# Patient Record
Sex: Female | Born: 1970 | Race: White | Hispanic: No | Marital: Single | Smoking: Current every day smoker
Health system: Southern US, Community
[De-identification: ages and names within clinical notes are randomized; demographics above are authoritative.]

## PROBLEM LIST (undated history)

## (undated) DIAGNOSIS — E079 Disorder of thyroid, unspecified: Secondary | ICD-10-CM

## (undated) DIAGNOSIS — E039 Hypothyroidism, unspecified: Secondary | ICD-10-CM

## (undated) HISTORY — PX: OTHER SURGICAL HISTORY: SHX169

## (undated) HISTORY — PX: ABDOMINAL HYSTERECTOMY: SHX81

---

## 1997-03-10 ENCOUNTER — Ambulatory Visit (HOSPITAL_COMMUNITY): Admission: RE | Admit: 1997-03-10 | Discharge: 1997-03-10 | Payer: Self-pay | Admitting: Obstetrics and Gynecology

## 1998-12-06 ENCOUNTER — Inpatient Hospital Stay (HOSPITAL_COMMUNITY): Admission: AD | Admit: 1998-12-06 | Discharge: 1998-12-06 | Payer: Self-pay | Admitting: Obstetrics & Gynecology

## 1998-12-19 ENCOUNTER — Inpatient Hospital Stay (HOSPITAL_COMMUNITY): Admission: AD | Admit: 1998-12-19 | Discharge: 1998-12-19 | Payer: Self-pay | Admitting: Obstetrics and Gynecology

## 1999-01-15 ENCOUNTER — Inpatient Hospital Stay (HOSPITAL_COMMUNITY): Admission: AD | Admit: 1999-01-15 | Discharge: 1999-01-15 | Payer: Self-pay | Admitting: Obstetrics and Gynecology

## 1999-01-26 ENCOUNTER — Inpatient Hospital Stay (HOSPITAL_COMMUNITY): Admission: AD | Admit: 1999-01-26 | Discharge: 1999-01-26 | Payer: Self-pay | Admitting: Obstetrics and Gynecology

## 1999-01-30 ENCOUNTER — Inpatient Hospital Stay (HOSPITAL_COMMUNITY): Admission: AD | Admit: 1999-01-30 | Discharge: 1999-01-30 | Payer: Self-pay | Admitting: Obstetrics & Gynecology

## 1999-02-23 ENCOUNTER — Inpatient Hospital Stay (HOSPITAL_COMMUNITY): Admission: AD | Admit: 1999-02-23 | Discharge: 1999-02-26 | Payer: Self-pay | Admitting: Obstetrics and Gynecology

## 1999-04-01 ENCOUNTER — Other Ambulatory Visit: Admission: RE | Admit: 1999-04-01 | Discharge: 1999-04-01 | Payer: Self-pay | Admitting: Obstetrics and Gynecology

## 2002-03-18 ENCOUNTER — Encounter: Payer: Self-pay | Admitting: Obstetrics and Gynecology

## 2002-03-18 ENCOUNTER — Ambulatory Visit (HOSPITAL_COMMUNITY): Admission: RE | Admit: 2002-03-18 | Discharge: 2002-03-18 | Payer: Self-pay | Admitting: Obstetrics and Gynecology

## 2002-09-01 ENCOUNTER — Other Ambulatory Visit: Admission: RE | Admit: 2002-09-01 | Discharge: 2002-09-01 | Payer: Self-pay | Admitting: Obstetrics and Gynecology

## 2002-09-29 ENCOUNTER — Observation Stay (HOSPITAL_COMMUNITY): Admission: RE | Admit: 2002-09-29 | Discharge: 2002-09-30 | Payer: Self-pay | Admitting: Obstetrics and Gynecology

## 2003-09-19 ENCOUNTER — Other Ambulatory Visit: Admission: RE | Admit: 2003-09-19 | Discharge: 2003-09-19 | Payer: Self-pay | Admitting: Obstetrics and Gynecology

## 2012-11-01 ENCOUNTER — Telehealth: Payer: Self-pay | Admitting: Genetic Counselor

## 2012-11-01 NOTE — Telephone Encounter (Signed)
[facility] APPT 01/08 @ 9 W/KAREN POWELL  REFERRING DR. Harold Hedge WELCOME PACKET MAILED.

## 2012-11-02 ENCOUNTER — Other Ambulatory Visit: Payer: Self-pay | Admitting: Obstetrics and Gynecology

## 2012-11-02 DIAGNOSIS — R928 Other abnormal and inconclusive findings on diagnostic imaging of breast: Secondary | ICD-10-CM

## 2012-11-15 ENCOUNTER — Ambulatory Visit
Admission: RE | Admit: 2012-11-15 | Discharge: 2012-11-15 | Disposition: A | Discharge status: Home / Self Care | Payer: PRIVATE HEALTH INSURANCE | Source: Ambulatory Visit | Attending: Obstetrics and Gynecology | Admitting: Obstetrics and Gynecology

## 2012-11-15 DIAGNOSIS — R928 Other abnormal and inconclusive findings on diagnostic imaging of breast: Secondary | ICD-10-CM

## 2013-02-03 ENCOUNTER — Ambulatory Visit (HOSPITAL_BASED_OUTPATIENT_CLINIC_OR_DEPARTMENT_OTHER): Payer: Self-pay | Admitting: Genetic Counselor

## 2013-02-03 ENCOUNTER — Other Ambulatory Visit: Payer: Self-pay

## 2013-02-03 ENCOUNTER — Encounter: Payer: Self-pay | Admitting: Genetic Counselor

## 2013-02-03 DIAGNOSIS — Z803 Family history of malignant neoplasm of breast: Secondary | ICD-10-CM

## 2013-02-03 NOTE — Progress Notes (Signed)
Patient did not show for her appointment.  This is the first no show.  If she would like to be rescheduled, please have her call the office.

## 2013-11-02 ENCOUNTER — Other Ambulatory Visit: Payer: Self-pay | Admitting: Obstetrics and Gynecology

## 2013-11-03 ENCOUNTER — Other Ambulatory Visit: Payer: Self-pay | Admitting: Obstetrics and Gynecology

## 2013-11-03 DIAGNOSIS — N6320 Unspecified lump in the left breast, unspecified quadrant: Secondary | ICD-10-CM

## 2013-11-04 LAB — CYTOLOGY - PAP

## 2013-11-10 ENCOUNTER — Other Ambulatory Visit: Payer: PRIVATE HEALTH INSURANCE

## 2013-11-14 ENCOUNTER — Encounter (INDEPENDENT_AMBULATORY_CARE_PROVIDER_SITE_OTHER): Payer: Self-pay

## 2013-11-14 ENCOUNTER — Ambulatory Visit
Admission: RE | Admit: 2013-11-14 | Discharge: 2013-11-14 | Disposition: A | Discharge status: Home / Self Care | Payer: PRIVATE HEALTH INSURANCE | Source: Ambulatory Visit | Attending: Obstetrics and Gynecology | Admitting: Obstetrics and Gynecology

## 2013-11-14 DIAGNOSIS — N6320 Unspecified lump in the left breast, unspecified quadrant: Secondary | ICD-10-CM

## 2014-03-29 ENCOUNTER — Encounter (HOSPITAL_COMMUNITY): Payer: Self-pay | Admitting: Emergency Medicine

## 2014-03-29 ENCOUNTER — Emergency Department (HOSPITAL_COMMUNITY): Payer: 59

## 2014-03-29 ENCOUNTER — Emergency Department (HOSPITAL_COMMUNITY): Payer: 59 | Admitting: Certified Registered"

## 2014-03-29 ENCOUNTER — Inpatient Hospital Stay (HOSPITAL_COMMUNITY)
Admission: EM | Admit: 2014-03-29 | Discharge: 2014-04-03 | DRG: 580 | Disposition: A | Discharge status: Home Health | Payer: 59 | Attending: General Surgery | Admitting: General Surgery

## 2014-03-29 ENCOUNTER — Encounter (HOSPITAL_COMMUNITY): Admission: EM | Disposition: A | Discharge status: Home Health | Payer: Self-pay | Source: Home / Self Care

## 2014-03-29 DIAGNOSIS — S55112A Laceration of radial artery at forearm level, left arm, initial encounter: Secondary | ICD-10-CM | POA: Diagnosis present

## 2014-03-29 DIAGNOSIS — D62 Acute posthemorrhagic anemia: Secondary | ICD-10-CM | POA: Diagnosis not present

## 2014-03-29 DIAGNOSIS — S41152A Open bite of left upper arm, initial encounter: Secondary | ICD-10-CM | POA: Diagnosis not present

## 2014-03-29 DIAGNOSIS — S41052A Open bite of left shoulder, initial encounter: Principal | ICD-10-CM | POA: Diagnosis present

## 2014-03-29 DIAGNOSIS — W540XXA Bitten by dog, initial encounter: Secondary | ICD-10-CM

## 2014-03-29 DIAGNOSIS — S81852A Open bite, left lower leg, initial encounter: Secondary | ICD-10-CM | POA: Diagnosis present

## 2014-03-29 DIAGNOSIS — S8992XA Unspecified injury of left lower leg, initial encounter: Secondary | ICD-10-CM

## 2014-03-29 DIAGNOSIS — S4992XA Unspecified injury of left shoulder and upper arm, initial encounter: Secondary | ICD-10-CM

## 2014-03-29 DIAGNOSIS — S71152A Open bite, left thigh, initial encounter: Secondary | ICD-10-CM | POA: Diagnosis present

## 2014-03-29 DIAGNOSIS — S51852A Open bite of left forearm, initial encounter: Secondary | ICD-10-CM | POA: Diagnosis present

## 2014-03-29 DIAGNOSIS — S61552A Open bite of left wrist, initial encounter: Secondary | ICD-10-CM | POA: Diagnosis present

## 2014-03-29 DIAGNOSIS — S55109A Unspecified injury of radial artery at forearm level, unspecified arm, initial encounter: Secondary | ICD-10-CM | POA: Diagnosis present

## 2014-03-29 DIAGNOSIS — S81859A Open bite, unspecified lower leg, initial encounter: Secondary | ICD-10-CM | POA: Diagnosis present

## 2014-03-29 DIAGNOSIS — S41159A Open bite of unspecified upper arm, initial encounter: Secondary | ICD-10-CM | POA: Diagnosis present

## 2014-03-29 DIAGNOSIS — D500 Iron deficiency anemia secondary to blood loss (chronic): Secondary | ICD-10-CM

## 2014-03-29 DIAGNOSIS — S61451A Open bite of right hand, initial encounter: Secondary | ICD-10-CM | POA: Diagnosis present

## 2014-03-29 HISTORY — DX: Disorder of thyroid, unspecified: E07.9

## 2014-03-29 HISTORY — PX: I&D EXTREMITY: SHX5045

## 2014-03-29 HISTORY — PX: COMPLEX WOUND CLOSURE: SHX6446

## 2014-03-29 LAB — COMPREHENSIVE METABOLIC PANEL
ALT: 12 U/L (ref 0–35)
AST: 28 U/L (ref 0–37)
Albumin: 2.6 g/dL — ABNORMAL LOW (ref 3.5–5.2)
Alkaline Phosphatase: 47 U/L (ref 39–117)
Anion gap: 7 (ref 5–15)
BUN: <5 mg/dL — ABNORMAL LOW (ref 6–23)
CALCIUM: 7.4 mg/dL — AB (ref 8.4–10.5)
CHLORIDE: 111 mmol/L (ref 96–112)
CO2: 20 mmol/L (ref 19–32)
CREATININE: 0.96 mg/dL (ref 0.50–1.10)
GFR calc non Af Amer: 71 mL/min — ABNORMAL LOW (ref >90)
GFR, EST AFRICAN AMERICAN: 83 mL/min — AB (ref >90)
Glucose, Bld: 275 mg/dL — ABNORMAL HIGH (ref 70–99)
POTASSIUM: 3.7 mmol/L (ref 3.5–5.1)
Sodium: 138 mmol/L (ref 135–145)
Total Bilirubin: 0.3 mg/dL (ref 0.3–1.2)
Total Protein: 4.6 g/dL — ABNORMAL LOW (ref 6.0–8.3)

## 2014-03-29 LAB — CK TOTAL AND CKMB (NOT AT ARMC)
CK, MB: 5.9 ng/mL — ABNORMAL HIGH (ref 0.3–4.0)
Relative Index: 1.2 (ref 0.0–2.5)
Total CK: 479 U/L — ABNORMAL HIGH (ref 7–177)

## 2014-03-29 LAB — CBC
HEMATOCRIT: 29.2 % — AB (ref 36.0–46.0)
HEMOGLOBIN: 9.9 g/dL — AB (ref 12.0–15.0)
MCH: 31.3 pg (ref 26.0–34.0)
MCHC: 33.9 g/dL (ref 30.0–36.0)
MCV: 92.4 fL (ref 78.0–100.0)
Platelets: 289 10*3/uL (ref 150–400)
RBC: 3.16 MIL/uL — AB (ref 3.87–5.11)
RDW: 13 % (ref 11.5–15.5)
WBC: 34 10*3/uL — ABNORMAL HIGH (ref 4.0–10.5)

## 2014-03-29 LAB — I-STAT CHEM 8, ED
BUN: 4 mg/dL — ABNORMAL LOW (ref 6–23)
CALCIUM ION: 1.09 mmol/L — AB (ref 1.12–1.23)
CHLORIDE: 107 mmol/L (ref 96–112)
Creatinine, Ser: 0.9 mg/dL (ref 0.50–1.10)
Glucose, Bld: 266 mg/dL — ABNORMAL HIGH (ref 70–99)
HEMATOCRIT: 30 % — AB (ref 36.0–46.0)
HEMOGLOBIN: 10.2 g/dL — AB (ref 12.0–15.0)
Potassium: 3.5 mmol/L (ref 3.5–5.1)
Sodium: 139 mmol/L (ref 135–145)
TCO2: 13 mmol/L (ref 0–100)

## 2014-03-29 LAB — PREPARE FRESH FROZEN PLASMA
Unit division: 0
Unit division: 0

## 2014-03-29 LAB — PROTIME-INR
INR: 1.23 (ref 0.00–1.49)
Prothrombin Time: 15.7 seconds — ABNORMAL HIGH (ref 11.6–15.2)

## 2014-03-29 LAB — ABO/RH: ABO/RH(D): O POS

## 2014-03-29 SURGERY — COMPLEX CLOSURE, WOUND
Anesthesia: General | Site: Leg Lower | Laterality: Left

## 2014-03-29 MED ORDER — OXYCODONE HCL 5 MG PO TABS: 5.0000 mg | ORAL_TABLET | Freq: Once | ORAL | Status: AC | PRN

## 2014-03-29 MED ORDER — SODIUM CHLORIDE 0.9 % IV BOLUS (SEPSIS)
1000.0000 mL | Freq: Once | INTRAVENOUS | Status: AC
  Administered 2014-03-29: 1000 mL via INTRAVENOUS

## 2014-03-29 MED ORDER — MIDAZOLAM HCL 5 MG/5ML IJ SOLN
INTRAMUSCULAR | Status: DC | PRN
Start: 2014-03-29 — End: 2014-03-30
  Administered 2014-03-29: 2 mg via INTRAVENOUS

## 2014-03-29 MED ORDER — MORPHINE SULFATE 4 MG/ML IJ SOLN
4.0000 mg | Freq: Once | INTRAMUSCULAR | Status: AC
  Administered 2014-03-29: 4 mg via INTRAVENOUS

## 2014-03-29 MED ORDER — PHENYLEPHRINE 40 MCG/ML (10ML) SYRINGE FOR IV PUSH (FOR BLOOD PRESSURE SUPPORT)
PREFILLED_SYRINGE | INTRAVENOUS | Status: AC
  Filled 2014-03-29: qty 10

## 2014-03-29 MED ORDER — HYDROMORPHONE HCL 1 MG/ML IJ SOLN
0.2500 mg | INTRAMUSCULAR | Status: DC | PRN
  Administered 2014-03-30 (×2): 0.5 mg via INTRAVENOUS

## 2014-03-29 MED ORDER — PROMETHAZINE HCL 25 MG/ML IJ SOLN
6.2500 mg | INTRAMUSCULAR | Status: DC | PRN
  Administered 2014-03-30: 12.5 mg via INTRAVENOUS

## 2014-03-29 MED ORDER — MIDAZOLAM HCL 2 MG/2ML IJ SOLN
INTRAMUSCULAR | Status: AC
  Filled 2014-03-29: qty 2

## 2014-03-29 MED ORDER — OXYCODONE HCL 5 MG/5ML PO SOLN: 5.0000 mg | Freq: Once | ORAL | Status: AC | PRN

## 2014-03-29 MED ORDER — SUCCINYLCHOLINE CHLORIDE 20 MG/ML IJ SOLN
INTRAMUSCULAR | Status: DC | PRN
  Administered 2014-03-29: 110 mg via INTRAVENOUS

## 2014-03-29 MED ORDER — LIDOCAINE HCL (CARDIAC) 20 MG/ML IV SOLN
INTRAVENOUS | Status: AC
  Filled 2014-03-29: qty 30

## 2014-03-29 MED ORDER — ONDANSETRON HCL 4 MG/2ML IJ SOLN
INTRAMUSCULAR | Status: AC
  Filled 2014-03-29: qty 2

## 2014-03-29 MED ORDER — FENTANYL CITRATE 0.05 MG/ML IJ SOLN
INTRAMUSCULAR | Status: DC | PRN
  Administered 2014-03-29: 50 ug via INTRAVENOUS

## 2014-03-29 MED ORDER — ROCURONIUM BROMIDE 50 MG/5ML IV SOLN
INTRAVENOUS | Status: AC
  Filled 2014-03-29: qty 1

## 2014-03-29 MED ORDER — PROPOFOL 10 MG/ML IV BOLUS
INTRAVENOUS | Status: AC
  Filled 2014-03-29: qty 20

## 2014-03-29 MED ORDER — ONDANSETRON HCL 4 MG/2ML IJ SOLN
4.0000 mg | Freq: Once | INTRAMUSCULAR | Status: AC
  Administered 2014-03-29: 4 mg via INTRAVENOUS

## 2014-03-29 MED ORDER — SODIUM CHLORIDE 0.9 % IR SOLN
Status: DC | PRN
  Administered 2014-03-29 (×2): 3000 mL

## 2014-03-29 MED ORDER — CEFAZOLIN SODIUM-DEXTROSE 2-3 GM-% IV SOLR
INTRAVENOUS | Status: DC | PRN
  Administered 2014-03-29: 2 g via INTRAVENOUS

## 2014-03-29 MED ORDER — NEOSTIGMINE METHYLSULFATE 10 MG/10ML IV SOLN
INTRAVENOUS | Status: AC
  Filled 2014-03-29: qty 6

## 2014-03-29 MED ORDER — EPHEDRINE SULFATE 50 MG/ML IJ SOLN
INTRAMUSCULAR | Status: AC
  Filled 2014-03-29: qty 1

## 2014-03-29 MED ORDER — SUCCINYLCHOLINE CHLORIDE 20 MG/ML IJ SOLN
INTRAMUSCULAR | Status: AC
  Filled 2014-03-29: qty 2

## 2014-03-29 MED ORDER — FENTANYL CITRATE 0.05 MG/ML IJ SOLN
INTRAMUSCULAR | Status: AC
  Filled 2014-03-29: qty 5

## 2014-03-29 MED ORDER — CEFAZOLIN SODIUM-DEXTROSE 2-3 GM-% IV SOLR
2.0000 g | Freq: Once | INTRAVENOUS | Status: AC
  Administered 2014-03-29: 2 g via INTRAVENOUS

## 2014-03-29 MED ORDER — LACTATED RINGERS IV SOLN
INTRAVENOUS | Status: DC | PRN
  Administered 2014-03-29 (×2): via INTRAVENOUS

## 2014-03-29 MED ORDER — PROPOFOL 10 MG/ML IV BOLUS
INTRAVENOUS | Status: DC | PRN
  Administered 2014-03-29: 120 mg via INTRAVENOUS

## 2014-03-29 MED ORDER — LIDOCAINE HCL (CARDIAC) 20 MG/ML IV SOLN
INTRAVENOUS | Status: DC | PRN
  Administered 2014-03-29: 80 mg via INTRAVENOUS

## 2014-03-29 MED ORDER — ALBUMIN HUMAN 5 % IV SOLN
INTRAVENOUS | Status: DC | PRN
  Administered 2014-03-29 (×2): via INTRAVENOUS

## 2014-03-29 MED ORDER — PHENYLEPHRINE HCL 10 MG/ML IJ SOLN
INTRAMUSCULAR | Status: DC | PRN
  Administered 2014-03-29: 80 ug via INTRAVENOUS
  Administered 2014-03-29: 160 ug via INTRAVENOUS
  Administered 2014-03-29 (×3): 80 ug via INTRAVENOUS
  Administered 2014-03-30: 160 ug via INTRAVENOUS

## 2014-03-29 MED ORDER — 0.9 % SODIUM CHLORIDE (POUR BTL) OPTIME
TOPICAL | Status: DC | PRN
  Administered 2014-03-29: 1000 mL

## 2014-03-29 SURGICAL SUPPLY — 63 items
BAG DECANTER FOR FLEXI CONT (MISCELLANEOUS) ×4 IMPLANT
BANDAGE ELASTIC 3 VELCRO ST LF (GAUZE/BANDAGES/DRESSINGS) ×4 IMPLANT
BANDAGE ELASTIC 4 VELCRO ST LF (GAUZE/BANDAGES/DRESSINGS) ×8 IMPLANT
BANDAGE ELASTIC 6 VELCRO ST LF (GAUZE/BANDAGES/DRESSINGS) ×12 IMPLANT
BNDG COHESIVE 1X5 TAN STRL LF (GAUZE/BANDAGES/DRESSINGS) ×8 IMPLANT
BNDG GAUZE ELAST 4 BULKY (GAUZE/BANDAGES/DRESSINGS) ×20 IMPLANT
CANISTER SUCTION 2500CC (MISCELLANEOUS) ×4 IMPLANT
CATH EMB 3FR 40CM (CATHETERS) ×4 IMPLANT
CLIP TI MEDIUM 6 (CLIP) ×4 IMPLANT
CLIP TI WIDE RED SMALL 6 (CLIP) ×4 IMPLANT
COVER PROBE W GEL 5X96 (DRAPES) ×4 IMPLANT
COVER SURGICAL LIGHT HANDLE (MISCELLANEOUS) ×4 IMPLANT
DRAPE EXTREMITY T 121X128X90 (DRAPE) IMPLANT
DRAPE LAPAROSCOPIC ABDOMINAL (DRAPES) IMPLANT
DRAPE PED LAPAROTOMY (DRAPES) ×4 IMPLANT
DRAPE PROXIMA HALF (DRAPES) ×4 IMPLANT
DRAPE UTILITY XL STRL (DRAPES) ×8 IMPLANT
DRSG PAD ABDOMINAL 8X10 ST (GAUZE/BANDAGES/DRESSINGS) ×4 IMPLANT
ELECT CAUTERY BLADE 6.4 (BLADE) ×4 IMPLANT
ELECT REM PT RETURN 9FT ADLT (ELECTROSURGICAL) ×4
ELECTRODE REM PT RTRN 9FT ADLT (ELECTROSURGICAL) ×2 IMPLANT
GAUZE SPONGE 4X4 12PLY STRL (GAUZE/BANDAGES/DRESSINGS) ×24 IMPLANT
GAUZE XEROFORM 5X9 LF (GAUZE/BANDAGES/DRESSINGS) ×20 IMPLANT
GLOVE BIO SURGEON STRL SZ7.5 (GLOVE) ×4 IMPLANT
GLOVE BIOGEL PI IND STRL 7.5 (GLOVE) ×2 IMPLANT
GLOVE BIOGEL PI IND STRL 8 (GLOVE) ×2 IMPLANT
GLOVE BIOGEL PI INDICATOR 7.5 (GLOVE) ×2
GLOVE BIOGEL PI INDICATOR 8 (GLOVE) ×2
GLOVE ECLIPSE 7.0 STRL STRAW (GLOVE) ×4 IMPLANT
GLOVE ECLIPSE 7.5 STRL STRAW (GLOVE) ×4 IMPLANT
GLOVE SS BIOGEL STRL SZ 7 (GLOVE) ×2 IMPLANT
GLOVE SUPERSENSE BIOGEL SZ 7 (GLOVE) ×2
GOWN STRL REUS W/ TWL LRG LVL3 (GOWN DISPOSABLE) ×4 IMPLANT
GOWN STRL REUS W/TWL LRG LVL3 (GOWN DISPOSABLE) ×6
KIT BASIN OR (CUSTOM PROCEDURE TRAY) ×8 IMPLANT
KIT ROOM TURNOVER OR (KITS) ×4 IMPLANT
LOOP VESSEL MAXI BLUE (MISCELLANEOUS) ×4 IMPLANT
LOOP VESSEL MINI RED (MISCELLANEOUS) ×4 IMPLANT
MARKER SKIN DUAL TIP RULER LAB (MISCELLANEOUS) ×4 IMPLANT
NS IRRIG 1000ML POUR BTL (IV SOLUTION) ×4 IMPLANT
PACK GENERAL/GYN (CUSTOM PROCEDURE TRAY) ×4 IMPLANT
PAD ARMBOARD 7.5X6 YLW CONV (MISCELLANEOUS) ×4 IMPLANT
SPONGE LAP 18X18 X RAY DECT (DISPOSABLE) ×8 IMPLANT
STAPLER VISISTAT (STAPLE) ×4 IMPLANT
STOCKINETTE IMPERVIOUS 9X36 MD (GAUZE/BANDAGES/DRESSINGS) IMPLANT
STOCKINETTE IMPERVIOUS LG (DRAPES) IMPLANT
STOCKINETTE TUBULAR 6 INCH (GAUZE/BANDAGES/DRESSINGS) ×4 IMPLANT
SUT ETHILON 4 0 PS 2 18 (SUTURE) ×32 IMPLANT
SUT SILK 2 0 TIES 10X30 (SUTURE) ×4 IMPLANT
SUT SILK 3 0 TIES 10X30 (SUTURE) ×4 IMPLANT
SUT VIC AB 2-0 CT1 27 (SUTURE) ×4
SUT VIC AB 2-0 CT1 TAPERPNT 27 (SUTURE) ×4 IMPLANT
SUT VIC AB 3-0 SH 27 (SUTURE) ×2
SUT VIC AB 3-0 SH 27X BRD (SUTURE) ×2 IMPLANT
SUT VICRYL RAPIDE 4/0 PS 2 (SUTURE) ×20 IMPLANT
SWAB COLLECTION DEVICE MRSA (MISCELLANEOUS) IMPLANT
SYR 20CC LL (SYRINGE) ×4 IMPLANT
SYR TB 1ML LUER SLIP (SYRINGE) ×4 IMPLANT
TOWEL OR 17X24 6PK STRL BLUE (TOWEL DISPOSABLE) ×4 IMPLANT
TOWEL OR 17X26 10 PK STRL BLUE (TOWEL DISPOSABLE) ×4 IMPLANT
TRAY FOLEY METER SIL LF 16FR (CATHETERS) ×4 IMPLANT
TUBE ANAEROBIC SPECIMEN COL (MISCELLANEOUS) IMPLANT
UNDERPAD 30X30 INCONTINENT (UNDERPADS AND DIAPERS) ×4 IMPLANT

## 2014-03-29 NOTE — ED Notes (Signed)
Attempting to establish 2nd IV site on right arm.   BP cuff taken off of right arm.  Unable to do BP on left arm due to damage to left arm.
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
## 2014-03-29 NOTE — Consult Note (Signed)
Reason for Consult:complex dog bites to left upper extremity Referring Physician: Jay Wyatt  Allison [name] is an 44 y.o. female.  HPI: s/p attack by own dog with multiple complex left upper extremity lacerations  No past medical history on file.  No past surgical history on file.  No family history on file.  Social History:  has no tobacco, alcohol, and drug history on file.  Allergies: Not on File  Medications: Scheduled:  Results for orders placed or performed during the hospital encounter of 03/29/14 (from the past 48 hour(s))  Prepare fresh frozen plasma     Status: None (Preliminary result)   Collection Time: 03/29/14  9:26 PM  Result Value Ref Range   Unit Number [id number]    Blood Component Type LIQ PLASMA    Unit division 00    Status of Unit ISSUED    Unit tag comment VERBAL ORDERS PER DR RANCOUR    Transfusion Status OK TO TRANSFUSE    Unit Number [id number]    Blood Component Type THW PLS APHR    Unit division 00    Status of Unit ISSUED    Unit tag comment VERBAL ORDERS PER DR RANCOUR    Transfusion Status OK TO TRANSFUSE   Type and screen     Status: None (Preliminary result)   Collection Time: 03/29/14 10:05 PM  Result Value Ref Range   ABO/RH(D) O POS    Antibody Screen PENDING    Sample Expiration 04/01/2014    Unit Number [id number]    Blood Component Type RBC LR PHER2    Unit division 00    Status of Unit ISSUED    Unit tag comment VERBAL ORDERS PER DR RANCOUR    Transfusion Status OK TO TRANSFUSE    Crossmatch Result PENDING    Unit Number [id number]    Blood Component Type RED CELLS,LR    Unit division 00    Status of Unit ISSUED    Unit tag comment VERBAL ORDERS PER DR RANCOUR    Transfusion Status OK TO TRANSFUSE    Crossmatch Result PENDING   I-Stat Chem 8, ED     Status: Abnormal   Collection Time: 03/29/14 10:18 PM  Result Value Ref Range   Sodium 139 135 - 145 mmol/L   Potassium 3.5 3.5 - 5.1 mmol/L   Chloride 107 96  - 112 mmol/L   BUN 4 (L) 6 - 23 mg/dL   Creatinine, Ser 0.90 0.50 - 1.10 mg/dL   Glucose, Bld 266 (H) 70 - 99 mg/dL   Calcium, Ion 1.09 (L) 1.12 - 1.23 mmol/L   TCO2 13 0 - 100 mmol/L   Hemoglobin 10.2 (L) 12.0 - 15.0 g/dL   HCT 30.0 (L) 36.0 - 46.0 %    Dg Chest Portable 1 View  03/29/2014   CLINICAL DATA:  Trauma.  Attacked by dog.  EXAM: PORTABLE CHEST - 1 VIEW  COMPARISON:  None.  FINDINGS: The heart size and mediastinal contours are within normal limits. There is no evidence of pulmonary edema, consolidation, pneumothorax, nodule or pleural fluid. The visualized skeletal structures are unremarkable.  IMPRESSION: No active disease.   Electronically Signed   By: Glenn  Yamagata M.D.   On: 03/29/2014 22:29    Review of Systems  All other systems reviewed and are negative.  Blood pressure 135/80, pulse 104, temperature 96.8 F (36 C), temperature source Temporal, resp. rate 14, SpO2 100 %. Physical Exam  Constitutional: She appears well-developed and well-nourished.  HENT:    Head: Normocephalic and atraumatic.  Cardiovascular: Normal rate.   Respiratory: Effort normal.  Musculoskeletal:       Left upper arm: She exhibits laceration.       Left forearm: She exhibits laceration.  Complex left upper extremity lacerations s/p dog attack  Skin: Skin is warm.  Psychiatric: She has a normal mood and affect. Her behavior is normal. Judgment and thought content normal.    Assessment/Plan: As above  Plan explore and repair as needed  Shaka Zech A 03/29/2014, 10:36 PM      

## 2014-03-29 NOTE — ED Notes (Addendum)
Patient was attacked by her own dog, American bulldog, allegedly unprovoked attack.  Patient with multiple bites on left arm, left thigh and left lower leg, below left eye and left upper lip.  Areas of tissue missing from lower leg, thigh and elbow on the left.  Patient arrived via [facility] with large amount of blood loss on scene.  Patient was given Fentanyl en route to ED.  Patient arrived CAOx3, vital signs stable, tachycardic at 110.  Per patient dog was up to date on all vaccinations.
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
## 2014-03-29 NOTE — Anesthesia Preprocedure Evaluation (Addendum)
Anesthesia Evaluation  Patient identified by MRN, date of birth, ID band Patient awake    Reviewed: Allergy & Precautions, NPO status , Patient's Chart, lab work & pertinent test results, Unable to perform ROS - Chart review only  History of Anesthesia Complications Negative for: history of anesthetic complications  Airway Mallampati: I  TM Distance: >3 FB Neck ROM: Full    Dental  (+) Partial Upper, Dental Advisory Given   Pulmonary Current Smoker,    Pulmonary exam normal       Cardiovascular negative cardio ROS      Neuro/Psych negative neurological ROS  negative psych ROS   GI/Hepatic negative GI ROS, Neg liver ROS,   Endo/Other  Hypothyroidism   Renal/GU negative Renal ROS     Musculoskeletal   Abdominal   Peds  Hematology   Anesthesia Other Findings   Reproductive/Obstetrics                            Anesthesia Physical Anesthesia Plan  ASA: III and emergent  Anesthesia Plan: General   Post-op Pain Management:    Induction: Intravenous and Rapid sequence  Airway Management Planned:   Additional Equipment:   Intra-op Plan: Delibrate Circulatory arrest per surgeon request  Post-operative Plan: Extubation in OR  Informed Consent: I have reviewed the patients History and Physical, chart, labs and discussed the procedure including the risks, benefits and alternatives for the proposed anesthesia with the patient or authorized representative who has indicated his/her understanding and acceptance.   Dental advisory given  Plan Discussed with: Anesthesiologist, Surgeon and CRNA  Anesthesia Plan Comments:        Anesthesia Quick Evaluation
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
## 2014-03-29 NOTE — Anesthesia Procedure Notes (Signed)
Procedure Name: Intubation Date/Time: 03/29/2014 10:52 PM Performed by: Arlice ColtMANESS, Shoua Ulloa B Pre-anesthesia Checklist: Patient identified, Emergency Drugs available, Suction available, Patient being monitored and Timeout performed Patient Re-evaluated:Patient Re-evaluated prior to inductionOxygen Delivery Method: Circle system utilized Preoxygenation: Pre-oxygenation with 100% oxygen Intubation Type: IV induction and Rapid sequence Laryngoscope Size: Mac and 3 Grade View: Grade II Tube type: Oral Tube size: 7.0 mm Number of attempts: 1 Airway Equipment and Method: Stylet Placement Confirmation: ETT inserted through vocal cords under direct vision,  positive ETCO2 and breath sounds checked- equal and bilateral Secured at: 21 cm Tube secured with: Tape Dental Injury: Teeth and Oropharynx as per pre-operative assessment

## 2014-03-29 NOTE — ED Notes (Signed)
Family at beside. Family given emotional support. 

## 2014-03-30 ENCOUNTER — Other Ambulatory Visit: Payer: Self-pay | Admitting: Orthopedic Surgery

## 2014-03-30 ENCOUNTER — Encounter (HOSPITAL_COMMUNITY): Payer: Self-pay | Admitting: General Surgery

## 2014-03-30 DIAGNOSIS — S61552A Open bite of left wrist, initial encounter: Secondary | ICD-10-CM | POA: Diagnosis present

## 2014-03-30 DIAGNOSIS — S55002A Unspecified injury of ulnar artery at forearm level, left arm, initial encounter: Secondary | ICD-10-CM

## 2014-03-30 DIAGNOSIS — W540XXA Bitten by dog, initial encounter: Secondary | ICD-10-CM

## 2014-03-30 DIAGNOSIS — S41052A Open bite of left shoulder, initial encounter: Secondary | ICD-10-CM | POA: Diagnosis present

## 2014-03-30 DIAGNOSIS — S55112A Laceration of radial artery at forearm level, left arm, initial encounter: Secondary | ICD-10-CM | POA: Diagnosis present

## 2014-03-30 DIAGNOSIS — S81852A Open bite, left lower leg, initial encounter: Secondary | ICD-10-CM | POA: Diagnosis present

## 2014-03-30 DIAGNOSIS — S51852A Open bite of left forearm, initial encounter: Secondary | ICD-10-CM | POA: Diagnosis present

## 2014-03-30 DIAGNOSIS — S45102A Unspecified injury of brachial artery, left side, initial encounter: Secondary | ICD-10-CM

## 2014-03-30 DIAGNOSIS — S41159A Open bite of unspecified upper arm, initial encounter: Secondary | ICD-10-CM | POA: Diagnosis present

## 2014-03-30 DIAGNOSIS — S71152A Open bite, left thigh, initial encounter: Secondary | ICD-10-CM | POA: Diagnosis present

## 2014-03-30 DIAGNOSIS — S81859A Open bite, unspecified lower leg, initial encounter: Secondary | ICD-10-CM | POA: Diagnosis present

## 2014-03-30 DIAGNOSIS — S61451A Open bite of right hand, initial encounter: Secondary | ICD-10-CM | POA: Diagnosis present

## 2014-03-30 DIAGNOSIS — D62 Acute posthemorrhagic anemia: Secondary | ICD-10-CM | POA: Diagnosis not present

## 2014-03-30 DIAGNOSIS — S41152A Open bite of left upper arm, initial encounter: Secondary | ICD-10-CM | POA: Diagnosis present

## 2014-03-30 LAB — CBC
HEMATOCRIT: 31.1 % — AB (ref 36.0–46.0)
Hemoglobin: 10.8 g/dL — ABNORMAL LOW (ref 12.0–15.0)
MCH: 30.3 pg (ref 26.0–34.0)
MCHC: 34.7 g/dL (ref 30.0–36.0)
MCV: 87.1 fL (ref 78.0–100.0)
Platelets: 173 10*3/uL (ref 150–400)
RBC: 3.57 MIL/uL — AB (ref 3.87–5.11)
RDW: 14.1 % (ref 11.5–15.5)
WBC: 14.3 10*3/uL — ABNORMAL HIGH (ref 4.0–10.5)

## 2014-03-30 LAB — BASIC METABOLIC PANEL
Anion gap: 8 (ref 5–15)
BUN: 5 mg/dL — AB (ref 6–23)
CHLORIDE: 110 mmol/L (ref 96–112)
CO2: 20 mmol/L (ref 19–32)
Calcium: 7.3 mg/dL — ABNORMAL LOW (ref 8.4–10.5)
Creatinine, Ser: 0.77 mg/dL (ref 0.50–1.10)
GFR calc non Af Amer: >90 mL/min (ref >90)
Glucose, Bld: 127 mg/dL — ABNORMAL HIGH (ref 70–99)
Potassium: 3.9 mmol/L (ref 3.5–5.1)
Sodium: 138 mmol/L (ref 135–145)

## 2014-03-30 LAB — POCT I-STAT 4, (NA,K, GLUC, HGB,HCT)
GLUCOSE: 188 mg/dL — AB (ref 70–99)
GLUCOSE: 208 mg/dL — AB (ref 70–99)
HCT: 23 % — ABNORMAL LOW (ref 36.0–46.0)
HEMATOCRIT: 18 % — AB (ref 36.0–46.0)
Hemoglobin: 6.1 g/dL — CL (ref 12.0–15.0)
Hemoglobin: 7.8 g/dL — ABNORMAL LOW (ref 12.0–15.0)
Potassium: 3.8 mmol/L (ref 3.5–5.1)
Potassium: 4.2 mmol/L (ref 3.5–5.1)
SODIUM: 139 mmol/L (ref 135–145)
SODIUM: 139 mmol/L (ref 135–145)

## 2014-03-30 LAB — BLOOD PRODUCT ORDER (VERBAL) VERIFICATION

## 2014-03-30 LAB — MRSA PCR SCREENING: MRSA by PCR: NEGATIVE

## 2014-03-30 LAB — PROTIME-INR
INR: 1.31 (ref 0.00–1.49)
Prothrombin Time: 16.5 seconds — ABNORMAL HIGH (ref 11.6–15.2)

## 2014-03-30 MED ORDER — BACITRACIN-NEOMYCIN-POLYMYXIN 400-5-5000 EX OINT
TOPICAL_OINTMENT | CUTANEOUS | Status: AC
  Filled 2014-03-30: qty 2

## 2014-03-30 MED ORDER — DOCUSATE SODIUM 100 MG PO CAPS
100.0000 mg | ORAL_CAPSULE | Freq: Two times a day (BID) | ORAL | Status: DC
  Administered 2014-03-30 – 2014-04-03 (×9): 100 mg via ORAL
  Filled 2014-03-30 (×9): qty 1

## 2014-03-30 MED ORDER — SODIUM CHLORIDE 0.9 % IV SOLN
INTRAVENOUS | Status: DC | PRN
  Administered 2014-03-29: via INTRAVENOUS

## 2014-03-30 MED ORDER — SODIUM CHLORIDE 0.9 % IR SOLN
Status: DC | PRN
  Administered 2014-03-30: 500 mL

## 2014-03-30 MED ORDER — PANTOPRAZOLE SODIUM 40 MG IV SOLR
40.0000 mg | Freq: Every day | INTRAVENOUS | Status: DC
  Filled 2014-03-30 (×3): qty 40

## 2014-03-30 MED ORDER — SODIUM CHLORIDE 0.9 % IV SOLN
3.0000 g | Freq: Four times a day (QID) | INTRAVENOUS | Status: DC
  Administered 2014-03-30 – 2014-03-31 (×8): 3 g via INTRAVENOUS
  Filled 2014-03-30 (×9): qty 3

## 2014-03-30 MED ORDER — BISACODYL 10 MG RE SUPP: 10.0000 mg | Freq: Every day | RECTAL | Status: DC | PRN

## 2014-03-30 MED ORDER — PANTOPRAZOLE SODIUM 40 MG PO TBEC
40.0000 mg | DELAYED_RELEASE_TABLET | Freq: Every day | ORAL | Status: DC
  Administered 2014-03-30 – 2014-04-02 (×4): 40 mg via ORAL
  Filled 2014-03-30 (×4): qty 1

## 2014-03-30 MED ORDER — BACIT-POLY-NEO HC 1 % EX OINT
TOPICAL_OINTMENT | CUTANEOUS | Status: DC | PRN
  Administered 2014-03-30: 1 via TOPICAL

## 2014-03-30 MED ORDER — OXYCODONE HCL 5 MG PO TABS
5.0000 mg | ORAL_TABLET | ORAL | Status: DC | PRN
  Administered 2014-03-30: 10 mg via ORAL
  Administered 2014-03-30 – 2014-03-31 (×2): 5 mg via ORAL
  Filled 2014-03-30 (×2): qty 2
  Filled 2014-03-30 (×2): qty 1

## 2014-03-30 MED ORDER — HYDROMORPHONE HCL 1 MG/ML IJ SOLN
INTRAMUSCULAR | Status: AC
  Filled 2014-03-30: qty 1

## 2014-03-30 MED ORDER — HYDROMORPHONE HCL 1 MG/ML IJ SOLN
1.0000 mg | INTRAMUSCULAR | Status: DC | PRN
  Administered 2014-03-30 (×10): 1 mg via INTRAVENOUS
  Administered 2014-03-31: 2 mg via INTRAVENOUS
  Administered 2014-03-31 (×2): 1 mg via INTRAVENOUS
  Administered 2014-03-31: 2 mg via INTRAVENOUS
  Filled 2014-03-30 (×7): qty 1
  Filled 2014-03-30: qty 2
  Filled 2014-03-30 (×4): qty 1
  Filled 2014-03-30: qty 2
  Filled 2014-03-30: qty 1

## 2014-03-30 MED ORDER — PROMETHAZINE HCL 25 MG/ML IJ SOLN
INTRAMUSCULAR | Status: AC
  Filled 2014-03-30: qty 1

## 2014-03-30 MED ORDER — ONDANSETRON HCL 4 MG/2ML IJ SOLN
4.0000 mg | Freq: Four times a day (QID) | INTRAMUSCULAR | Status: DC | PRN
  Administered 2014-04-01 – 2014-04-03 (×3): 4 mg via INTRAVENOUS
  Filled 2014-03-30 (×6): qty 2

## 2014-03-30 MED ORDER — SODIUM CHLORIDE 0.9 % IV SOLN
1.0000 g | Freq: Once | INTRAVENOUS | Status: AC
  Administered 2014-03-30: 1 g via INTRAVENOUS
  Filled 2014-03-30: qty 10

## 2014-03-30 MED ORDER — SODIUM CHLORIDE 0.9 % IV SOLN
INTRAVENOUS | Status: DC
  Administered 2014-03-30: 125 mL via INTRAVENOUS
  Administered 2014-03-31 – 2014-04-02 (×3): via INTRAVENOUS

## 2014-03-30 NOTE — Op Note (Signed)
See note [id number]

## 2014-03-30 NOTE — Op Note (Signed)
NAMDonnella Sham:  [name], Allison             ACCOUNT NO.:  [id number]  MEDICAL RECORD NO.:  [id number]  LOCATION:  2S05C                        FACILITY:  [facility]  PHYSICIAN:  Artist PaisMatthew A. Kem Parcher, M.D.DATE OF BIRTH:  1970-03-13  DATE OF PROCEDURE:  03/30/2014 DATE OF DISCHARGE:                              OPERATIVE REPORT   PREOPERATIVE DIAGNOSIS:  Complex left upper extremity laceration, status post dog bite left upper extremity as well as right hand dog bite.  POSTOPERATIVE DIAGNOSIS:  Complex left upper extremity laceration, status post dog bite left upper extremity as well as right hand dog bite.  PROCEDURE:  Left upper extremity exploration with incision and drainage and debridement of multiple wounds with loose primary closure as well as exploration of brachial artery, tying of the radial artery and at the antecubital fossa, as well as exploration of radial nerve at the wrist, median nerve at the wrist, ulnar artery at the wrist, and ulnar nerve at the wrist.  OPERATIVE DESCRIPTION:  Allison [name] is a 44 year old female, who was attacked by her own dog sustaining multiple complex upper extremity wounds on the left as well as on her right hand, as well as lower extremity.  Lower extremities were taken care of by the Trauma Service. Upper extremity was taken care of by myself and also Dr. Phillips GroutJ Lawson, Vascular Service.  The patient was taken to the operating suite and after the induction of adequate general anesthetic, upper extremities left and right and lower extremities were prepped and draped in the usual sterile fashion.  The left upper extremity was explored.  There were multiple complex lacerations including 1 in the webspace of the thumb, open thumb nail bed injury, transverse laceration at the distal forearm and wrist crease with median nerve exposed, as well as complex forearm lacerations and upper arm lacerations including the large complex __________ antecubital fossa with  exposed neurovascular structures.  All the wounds were irrigated with 9 L of normal saline. We debrided nonviable muscle out of the proximal wounds.  We explored the distal wound.  The distal wound and the median nerve was intact. The radial artery was intact.  The ulnar artery and ulnar nerve were intact.  The proximal forearm wounds were all explored.  In the antecubital fossa, we identified the median nerve what appeared to be the main branch of the brachial artery and then what appeared to be a loss of the integrity of the radial artery branch.  The ulnar artery branch appeared to be intact.  The upper arm area had several deep lacerations as well.  These were again thoroughly irrigated.  Nonviable tissue was debrided.  After this was undertaken, Dr. __________ Hart [name], Vascular Service also scrubbed in and identified the avulsed radial branch.  The brachial artery and the ulnar side were intact. Dopplerable pulses were seen at the brachial artery area as well as distally in the ulnar side.  There was retrograde flow into the radial artery.  Radial artery was unfortunately unreconstructable, was ligated and tied off with 3-0 silk ties, as well as several large veins.  At the end of this procedure, we did one final inspection of all the wounds and loosely closed them to  cover all vital structures with 4-0 Vicryl Rapide again very loosely.  After this was completed, we loosely dressed with Xeroform, 4x4s, and a compressive wrap.  We then prepped and draped the right hand.  Right hand had some laceration at the lower aspect of the thumb and also of the index finger.  These were dressed with Xeroform, 4x4s, and Coban wrap.  The patient's lower extremity issues were dealt with by Dr. __________, Trauma Service.  The patient was then taken to the recovery room in stable fashion.     Artist Pais Mina Marble, M.D.     MAW/MEDQ  D:  03/30/2014  T:  03/30/2014  Job:  [id number]

## 2014-03-30 NOTE — Progress Notes (Signed)
Inpatient Diabetes Program Recommendations  AACE/ADA: New Consensus Statement on Inpatient Glycemic Control (2013)  Target Ranges:  Prepandial:   less than 140 mg/dL      Peak postprandial:   less than 180 mg/dL (1-2 hours)      Critically ill patients:  140 - 180 mg/dL   Results for Allison [name], Allison [name] (MRN [id number]) as of 03/30/2014 10:32  Ref. Range 03/29/2014 22:05 03/29/2014 22:18 03/29/2014 23:41 03/30/2014 00:42 03/30/2014 05:00  Glucose Latest Range: 70-99 mg/dL 782275 (H) 956266 (H) 213208 (H) 188 (H) 127 (H)   Diabetes history: No Outpatient Diabetes medications: NA Current orders for Inpatient glycemic control: None  Inpatient Diabetes Program Recommendations Correction (SSI): Please consider ordering CBGs with Novolog correction scale if needed. HgbA1C: May want to consider ordering an A1C to evaluate glycemic control over the past 2-3 months. However, not sure of how accurate A1C would be given patient lost large amount of blood due to injuries sustained.  Note: Initial lab glucose was 275 mg/dl on 0/8/653/2/16 at 78:4622:05. Question elevated glucose related to stress hyperglycemia. Lab glucose this morning was 127 mg/dl at 9:625:00 am. Please consider ordering CBGs with Novolog correction scale if needed and may want to order an A1C.  Thanks, Orlando PennerMarie Shree Espey, RN, MSN, CCRN, CDE Diabetes Coordinator Inpatient Diabetes Program [phone] (Team Pager) [phone] ([facility] office) [phone] [facility] office)

## 2014-03-30 NOTE — Evaluation (Signed)
Occupational Therapy Evaluation Patient Details Name: Allison Allison [name] MRN: [id number] DOB: 1970/10/22 Today's Date: 03/30/2014    History of Present Illness Pt is a 44 y.o. female admitted after dog bite at home with multiple lacerations to LUE, LLE, R hand, s/p Exploration left brachial radial and ulnar artery with ligation radial artery at origin and distally, thrombectomy left radial artery   Clinical Impression   PTA pt lived at home with her family and was independent with ADLs. Pt was lethargic from pain medication today but agreeable to gentle PROM of LUE. Brief bedside evaluation completed. Per PT note, pt required min A hand held assist to stand, but was limited by lightheadedness and nausea today. Performed gentle PROM to LUE hand as tolerated and educated pt on edema control. Pt will benefit from acute OT for further education and training on edema control, ROM exercises, and compensatory techniques for ADLs. Recommend that pt perform ROM exercises and progress rehab of UE as directed by MD.     Follow Up Recommendations  Outpatient OT;Other (comment) (progress Rehab of LUE as directed by MD)    Equipment Recommendations  Other (comment) (TBD)    Recommendations for Other Services       Precautions / Restrictions Precautions Precautions: Fall      Mobility Bed Mobility               General bed mobility comments: Pt in bed and lethargic from pain medication. Not assessed at this time.   Transfers                 General transfer comment: Not assessed at this time. Pt lethargic from pain medicaiton.          ADL Overall ADL's : Allison [name] assistance/impaired Eating/Feeding: Bed level;Moderate assistance   Grooming: Moderate assistance;Bed level   Upper Body Bathing: Bed level;Maximal assistance       Upper Body Dressing : Maximal assistance;Bed level                     General ADL Comments: Pt recently returned to bed and given dilaudid.  Pt is lethargic and performed brief bedside eval with ROM of LUE as tolerated.      Vision Additional Comments: Pt reports no change from baseline.           Pertinent Vitals/Pain Pain Assessment: Faces Faces Pain Scale: Hurts even more Pain Location: Left side Pain Descriptors / Indicators: Aching;Numbness Pain Intervention(s): Limited activity within patient's tolerance;Monitored during session;Premedicated before session;Repositioned        Extremity/Trunk Assessment Upper Extremity Assessment Upper Extremity Assessment: LUE deficits/detail;RUE deficits/detail RUE Deficits / Details: R hand wtih lacerations and gauze/dressing on thumb and 2nd digit.  Light touch and pressure intact LUE Deficits / Details: minimal AROM of L shoulder, likely due to numbness and weight of splint. Pt is splinted from wrist to shoulder with elbow  splinted in mild flexion (~5*). Pt able to minimally wiggle fingers with best movement in 5th,4th, and 2nd digits. Thumb is wrapped from MCP to tip. Edema noted in shoulder and hand/fingers. Pt has very minimal movement in MCPs due to swelling. Pt can feel touch on shoulder and palm and back of hand, but reports that it "feels numb, like it's asleep." Pt also reports pressure near shoulder.  LUE: Unable to fully assess due to pain;Unable to fully assess due to immobilization LUE Sensation: decreased light touch LUE Coordination: decreased fine motor;decreased gross motor   Lower Extremity  Assessment Lower Extremity Assessment: Defer to PT evaluation   Cervical / Trunk Assessment Cervical / Trunk Assessment: Normal   Communication Communication Communication: No difficulties   Cognition Arousal/Alertness: Lethargic;Suspect due to medications Behavior During [facility] for tasks assessed/performed Overall Cognitive Status: Within Functional Limits for tasks assessed                        Exercises Exercises: Other exercises Other  Exercises Other Exercises: provided gentle PROM to LUE hand within tolerance. MCPs with edema and very minimal movement, however gently increased ROM of DIP and PIP through passive flexion and extension. Pt lethargic and falling asleep during PROM, however would awake from pain.         Home Living Family/patient expects to be discharged to:: Private residence Living Arrangements: Spouse/significant other;Children Available Help at Discharge: Family;Available 24 hours/day Type of Home: House Home Access: Stairs to enter Enter[employer] CorporationEntrance Stairs-Number of Steps: 2   Home Layout: Two level;Full bath on main level;Able to live on main level with bedroom/bathroom               Home Equipment: None          Prior Functioning/Environment Level of Independence: Independent             OT Diagnosis: Acute pain   OT Problem List: Decreased strength;Decreased range of motion;Decreased activity tolerance;Impaired balance (sitting and/or standing);Decreased coordination;Impaired sensation;Impaired UE functional use;Pain;Increased edema   OT Treatment/Interventions: Self-care/ADL training;Therapeutic exercise;Energy conservation;DME and/or AE instruction;Therapeutic activities;Patient/family education;Balance training    OT Goals(Current goals can be found in the care plan section) Acute Rehab OT Goals Patient Stated Goal: return to work and family OT Goal Formulation: With patient Time For Goal Achievement: 04/13/14 Potential to Achieve Goals: Good ADL Goals Pt Will Perform Eating: with set-up;sitting Pt Will Perform Grooming: with set-up;sitting Pt Will Perform Upper Body Bathing: sitting;with min assist Pt Will Perform Upper Body Dressing: with min assist;sitting Pt Will Transfer to Toilet: with supervision;ambulating Pt Will Perform Toileting - Clothing Manipulation and hygiene: with supervision;sit to/from stand Pt/caregiver will Perform Home Exercise Program: Increased ROM;Left  upper extremity;Independently;With written HEP provided  OT Frequency: Min 3X/week    End of Session Nurse Communication: Other (comment) (repositioned for edema control)  Activity Tolerance: Patient limited by pain;Patient limited by lethargy Patient left: in bed;with call bell/phone within reach   Time: 1400-1419 OT Time Calculation (min): 19 min Charges:  OT General Charges $OT Visit: 1 Procedure OT Evaluation $Initial OT Evaluation Tier I: 1 Procedure G-Codes:    Rae LipsMiller, Jayce Boyko M 03/30/2014, 3:26 PM   Carney LivingLeeAnn Marie Porter Moes, OTR/L Occupational Therapist [phone] (pager)

## 2014-03-30 NOTE — Progress Notes (Signed)
Paged TRAUMA PA to inform them patient's numbness/heaviness feeling in arm is still present, TRAUMA MD,VASCULAR MD, and ORTHO were made aware during their individual rounds.  +dopple palm pulse/ulnar pulse, +sensation in arm from shoulder to fingertips, +pain, + warm/WNL/swelling   Will monitor

## 2014-03-30 NOTE — Progress Notes (Signed)
Chaplain met with pt, pt's spouse and brother in law at bedside.  Chaplain shared that daughter is doing well and wishes to meet with pt sometime today.  RN's on both units are working out a plan for pt's to meet, possibly in solarium on 3rd floor.  Pt is still emotional when sharing about event, spouse also seems upset at bedside.  Pt eating jello during visit, smiling and engaging in conversation.  Chaplain provided emotional support as well as ministry of non-anxious presence and empathetic listening.  Chaplain will continue to follow up.    03/30/14 0900  Clinical Encounter Type  Visited With Patient and family together  Visit Type Initial;Social support;Critical Care  Referral From Chaplain  Spiritual Encounters  Spiritual Needs Emotional;Grief support  Stress Factors  Patient Stress Factors Exhausted;Family relationships;Health changes  Family Stress Factors Exhausted;Family relationships;Health changes   Overcash, Peter A, Chaplain 03/30/2014 9:49 AM  

## 2014-03-30 NOTE — Progress Notes (Signed)
Clinical Social Work Department BRIEF PSYCHOSOCIAL ASSESSMENT 03/30/2014  Patient:  Allison [name], Allison [name]     Account Number:  [id number]     Admit date:  03/29/2014  Clinical Social Worker:  Ulyess Blossom  Date/Time:  03/30/2014 10:33 PM  Referred by:  CSW  Date Referred:  03/30/2014 Referred for  Psychosocial assessment   Other Referral:   Interview type:  Patient Other interview type:   Database review.    PSYCHOSOCIAL DATA Living Status:  FAMILY Admitted from facility:   Level of care:   Primary support name:  ruby lawrence Primary support relationship to patient:  PARENT Degree of support available:   Pt reports good support from her mother,father and husband, as well as her 2 minor children, ages 62 and 25.    CURRENT CONCERNS Current Concerns  None Noted   Other Concerns:   Emotional support/adjustment to injuries.    SOCIAL WORK ASSESSMENT / PLAN CSW met with pt prior to her tx to tx to SDU.  Role of CSW d/c planning explained.  Pt was failrly lethargic during interview due to the pain meds she had on-board.  Pt was able to confirm that she lived at home with her husband and children and was independent with ADLs pta.  Pt recalled meeting with her daughter Laurette Schimke) today and was able to share some details of the dog attack with CSW.  Pt stated that their dog bit into Haley's neck and pt tried to pull the dog off, when he attacked her.  Pt told CSW that this behavior was not expected from the dog.  CSW unable to get much more information from pt as she kept nodding off. SBIRT was attempted. Trauma CSW will continue to follow.   Assessment/plan status:  Psychosocial Support/Ongoing Assessment of Needs Other assessment/ plan:   Information/referral to community resources:    PATIENT'S/FAMILY'S RESPONSE TO PLAN OF CARE: Pt sleepy from pain medications.  She feel asleep several times during CSW interview and was able to provide minimal information.  SBIRT to be  completed at more appropriate time.

## 2014-03-30 NOTE — Op Note (Signed)
OPERATIVE REPORT  DATE OF OPERATION:03/30/2014  PATIENT:  Allison [name]  44 y.o. female  PRE-OPERATIVE DIAGNOSIS:  multiple dog bites  POST-OPERATIVE DIAGNOSIS:  multiple dog bites  PROCEDURE:  Procedure(s): CLOSURE MULTIPLE LACERATIONS LEFT LOWER LEG AND THIGH, total of twenty lacerations, 15 complex repairs, 5 simple, see below for detail of laceration lengths  SURGEON:  Surgeon(s): Frederik SchmidtJay Shady Bradish, MD [name], MD Pryor OchoaJames D Lawson, MD  ASSISTANT: None  ANESTHESIA:   general  EBL: 300 ml  BLOOD ADMINISTERED: 650 CC PRBC  DRAINS: Urinary Catheter (Foley)   SPECIMEN:  No Specimen  COUNTS CORRECT:  YES  PROCEDURE DETAILS: The patient was taken to the operating room and placed on the table in the supine position. Her arms were placed on it 90 to body because of multiple dog bite lacerations of both upper extremities. Her left leg was only lower extremity injured. She had multiple lacerations counting a total of 20 there were complex and required repair.  After she was intubated a proper timeout was performed identifying the patient and the procedures proposed including repair and expiration of left upper extremity and repair and irrigation and debridement and suturing of left lower extremity lacerations. The patient had a total of 20 lacerations of her left lower extremity which were repaired. The lacerations were listed as follows. #1 a 7.5 cm medial thigh laceration; #2 a 1 cm medial thigh laceration which was stapled; #3 a 4 cm anterior medial thigh laceration; #4 a 1 cm anterior medial thigh laceration;  #5 a 1 cm anterior thigh laceration; #6 a 1.5 cm lateral thigh laceration; #7 a 3.5 cm lateral thigh laceration; #8 a 5 cm anterior lateral thigh laceration;  #9 a 1.5 cm anterior thigh laceration, #10 a 9 cm anterior tibial laceration; #11 a 2 cm anterior tibial laceration; #12 a 9.5 cm medial left lower extremity laceration; #13 a 2 cm anterior laceration; #14 a 5 cm anterior  lower left lower extremity laceration; #15 a 1 cm anterior tibial laceration; #16 a 1.5 cm anterior tibial laceration; #17 a 3 cm posterior distal left lower extremity laceration; #18 a 3.5 cm posterior extremity laceration on the left; #19 a 2 cm posterior left lower extremity laceration and # 20 a 5 cm posterior left lower extremity laceration.  Copious amounts of irrigation was performed with Betadine solution, saline solution and all the wounds were scrubbed with a Betadine brush prior to closure. All of the listed lacerations above that were greater than 1.5 cm in size were repaired in one or two layers of 3-0 Vicryl for deep layer and running simple stitches of 4-0 nylon. All wounds were completely closed. Those less than 1.5 cm in size were stapled closed. This was done concomitant with irrigation debridement repair and exploration of left upper extremity complex laceration with the hand surgeon and the vascular surgeon.  Once the lacerations were completely repaired the wounds were washed and triple antibiotic ointment applied to all wounds followed by 4 x 4's, Kerlix gauze, and an Ace wrap. All counts were correct including needles, sponges, and instruments.   PATIENT DISPOSITION:  PACU - guarded condition.   Keron Neenan, JAY 3/3/20161:23 AM

## 2014-03-30 NOTE — Transfer of Care (Signed)
Immediate Anesthesia Transfer of Care Note  Patient: Allison [name]  Procedure(s) Performed: Procedure(s): CLOSURE MULTIPLE LACERATIONS LEFT LEG AND Left ARM, Embolectomy., LIGATION OF RADIAL ARTERY (Left) IRRIGATION AND DEBRIDEMENT CLOSURE MULTIPLE LACERATIONS HAND AND ARM, lower legs (Left)  Patient Location: PACU  Anesthesia Type:General  Level of Consciousness: awake and patient cooperative  Airway & Oxygen Therapy: Patient Spontanous Breathing and Patient connected to nasal cannula oxygen  Post-op Assessment: Report given to RN and Post -op Vital signs reviewed and stable  Post vital signs: Reviewed and stable  Last Vitals:  Filed Vitals:   03/30/14 0140  BP: 120/84  Pulse:   Temp: 35.9 C  Resp:     Complications: No apparent anesthesia complications

## 2014-03-30 NOTE — Progress Notes (Addendum)
Patient ID: Allison [name], female   DOB: March 31, 1970, 44 y.o.   MRN: [id number] Vascular surgery  Patient has brisk arterial flow and ulnar artery and left wrist and also arterial flow in palmar arch and left hand. She does have motion and sensation in the hand. Arterial supply certainly appears adequate with good flow through ulnar artery and retrograde flow of radial artery to area where it was avulsed and transected in  the proximal forearm.  Continue to follow arterial flow by Doppler and do not feel further vascular intervention will be necessary

## 2014-03-30 NOTE — Evaluation (Signed)
Physical Therapy Evaluation Patient Details Name: Allison [name] MRN: [id number] DOB: July 27, 1970 Today's Date: 03/30/2014   History of Present Illness  Pt admitted after dog bite at home with multiple lacerations to LUE, LLE, R hand, s/p Exploration left brachial radial and ulnar artery with ligation radial artery at origin and distally, thrombectomy left radial artery  Clinical Impression  Pt is very pleasant and willing to move. States it feels weird to stand on LLE but no LOB or buckling in standing. Mobility limited by lightheadedness and nausea, premedicated and probably due to meds. Pt with below deficits who will benefit from acute therapy to maximize mobility, strength, function and gait to return pt to PLOF and decrease burden of care. Anticipate quick progression and will defer LUE deficits to OT. Encouraged increased ROM and mobility with nursing as able.     Follow Up Recommendations Outpatient PT    Equipment Recommendations  None recommended by PT    Recommendations for Other Services OT consult     Precautions / Restrictions Precautions Precautions: Fall      Mobility  Bed Mobility Overal bed mobility: Needs Assistance Bed Mobility: Rolling;Sidelying to Sit Rolling: Min assist Sidelying to sit: Mod assist       General bed mobility comments: cues and assist to elevate trunk from surface and scoot pelvis to EOB  Transfers Overall transfer level: Needs assistance Equipment used: 1 person hand held assist Transfers: Sit to/from [employer] Sit to Stand: Min assist Stand pivot transfers: Min assist       General transfer comment: RUE supported with cues for sequence. Stood x 2 from bed and pivoted to chair. Did not attempt ambulation secondary to pt feeling lightheaded and nauseated but no drop in BP.   Ambulation/Gait                Stairs            Wheelchair Mobility    Modified Rankin (Stroke Patients Only)        Balance Overall balance assessment: Needs assistance   Sitting balance-Leahy Scale: Good       Standing balance-Leahy Scale: Good                               Pertinent Vitals/Pain Pain Assessment: 0-10 Pain Score: 6  Pain Location: entire left side Pain Descriptors / Indicators: Aching;Numbness Pain Intervention(s): Limited activity within patient's tolerance;Premedicated before session;Repositioned    Home Living Family/patient expects to be discharged to:: Private residence Living Arrangements: Spouse/significant other;Children Available Help at Discharge: Family;Available 24 hours/day Type of Home: House Home Access: Stairs to enter   [employer] of Steps: 2 Home Layout: Two level;Full bath on main level;Able to live on main level with bedroom/bathroom Home Equipment: None      Prior Function Level of Independence: Independent               Hand Dominance        Extremity/Trunk Assessment   Upper Extremity Assessment: LUE deficits/detail       LUE Deficits / Details: pt with very limited AROM LUE shoulder, elbow and digits. Able to wiggle fingers slightly, shrug shoulder . ROM limited by pain, edema and dressing. Can feel touch on shoulder and palm   Lower Extremity Assessment: LLE deficits/detail   LLE Deficits / Details: limited by pain grossly 3/5 strength did not resist secondary to pain, decreased hip and knee ROM  Cervical / Trunk Assessment: Normal  Communication   Communication: No difficulties  Cognition Arousal/Alertness: Awake/alert Behavior During Therapy: WFL for tasks assessed/performed Overall Cognitive Status: Within Functional Limits for tasks assessed                      General Comments      Exercises General Exercises - Lower Extremity Short Arc Quad: AROM;Left;5 reps;Seated      Assessment/Plan    PT Assessment Patient needs continued PT services  PT Diagnosis Difficulty  walking;Acute pain   PT Problem List Decreased strength;Decreased activity tolerance;Decreased balance;Decreased mobility;Pain  PT Treatment Interventions DME instruction;Gait training;Stair training;Functional mobility training;Therapeutic activities;Therapeutic exercise;Patient/family education   PT Goals (Current goals can be found in the Care Plan section) Acute Rehab PT Goals Patient Stated Goal: return home and to work (works with [employer]) PT Goal Formulation: With patient Time For Goal Achievement: 04/06/14 Potential to Achieve Goals: Good    Frequency Min 4X/week   Barriers to discharge        Co-evaluation               End of Session   Activity Tolerance: Patient tolerated treatment well;Patient limited by pain Patient left: in chair;with call bell/phone within reach;with nursing/sitter in room;with family/visitor present Nurse Communication: Mobility status         Time: [id number] PT Time Calculation (min) (ACUTE ONLY): 35 min   Charges:   PT Evaluation $Initial PT Evaluation Tier I: 1 Procedure PT Treatments $Therapeutic Activity: 8-22 mins   PT G CodesDelorse Lek:        Tabor, Hildred Mollica Beth 03/30/2014, 11:26 AM Delaney MeigsMaija Tabor Lillyian Heidt, PT [phone]

## 2014-03-30 NOTE — Progress Notes (Signed)
UR completed.  Shalika Arntz, RN BSN MHA CCM Trauma/Neuro ICU Case Manager [phone]  

## 2014-03-30 NOTE — Progress Notes (Addendum)
Patient ID: Allison [name], female   DOB: 04/13/1970, 44 y.o.   MRN: [id number] 1 Day Post-Op  Subjective: Sore, not much appetite  Objective: Vital signs in last 24 hours: Temp:  [96.6 F (35.9 C)-98.2 F (36.8 C)] 98.2 F (36.8 C) (03/03 0758) Pulse Rate:  [77-110] 87 (03/03 0700) Resp:  [10-23] 10 (03/03 0700) BP: (115-150)/(72-92) 117/72 mmHg (03/03 0700) SpO2:  [93 %-100 %] 100 % (03/03 0700) Weight:  [130 lb (58.968 kg)] 130 lb (58.968 kg) (03/02 2235)    Intake/Output from previous day: 03/02 0701 - 03/03 0700 In: 5026.3 [I.V.:3706.3; Blood:670; IV Piggyback:600] Out: 755 [Urine:755] Intake/Output this shift:    General appearance: alert and cooperative Resp: clear to auscultation bilaterally Cardio: regular rate and rhythm GI: soft, NT, ND Extremities: R thumb and index dressed, LUE large dressing - fingers warm and moving, LLE dressings with Ace wraps Pulses: doppler signal L ulnar artery and L palmar arch  Lab Results: CBC   Recent Labs  03/29/14 2205  03/30/14 0042 03/30/14 0500  WBC 34.0*  --   --  14.3*  HGB 9.9*  < > 7.8* 10.8*  HCT 29.2*  < > 23.0* 31.1*  PLT 289  --   --  173  < > = values in this interval not displayed. BMET  Recent Labs  03/29/14 2205 03/29/14 2218  03/30/14 0042 03/30/14 0500  NA 138 139  < > 139 138  K 3.7 3.5  < > 4.2 3.9  CL 111 107  --   --  110  CO2 20  --   --   --  20  GLUCOSE 275* 266*  < > 188* 127*  BUN <5* 4*  --   --  5*  CREATININE 0.96 0.90  --   --  0.77  CALCIUM 7.4*  --   --   --  7.3*  < > = values in this interval not displayed. PT/INR  Recent Labs  03/29/14 2205 03/30/14 0500  LABPROT 15.7* 16.5*  INR 1.23 1.31   ABG No results for input(s): PHART, HCO3 in the last 72 hours.  Invalid input(s): PCO2, PO2  Studies/Results: Dg Forearm Left  03/29/2014   CLINICAL DATA:  Attacked by dog sustaining multiple soft tissue injuries of left arm.  EXAM: LEFT FOREARM - 2 VIEW  COMPARISON:  None.   FINDINGS: The left forearm demonstrates massive soft tissue injury, particularly involving the proximal forearm and antecubital region. Soft tissue gas is present. No evidence of fracture or soft tissue foreign body.  IMPRESSION: Massive soft tissue injury to the left forearm without visible underlying foreign body or fracture of bone.   Electronically Signed   By: Irish Lack M.D.   On: 03/29/2014 22:34   Dg Chest Portable 1 View  03/29/2014   CLINICAL DATA:  Trauma.  Attacked by dog.  EXAM: PORTABLE CHEST - 1 VIEW  COMPARISON:  None.  FINDINGS: The heart size and mediastinal contours are within normal limits. There is no evidence of pulmonary edema, consolidation, pneumothorax, nodule or pleural fluid. The visualized skeletal structures are unremarkable.  IMPRESSION: No active disease.   Electronically Signed   By: Irish Lack M.D.   On: 03/29/2014 22:29   Dg Humerus Left  03/29/2014   CLINICAL DATA:  Multiple dog bites to the left arm. Initial encounter.  EXAM: LEFT HUMERUS - 1+ VIEW  COMPARISON:  None.  FINDINGS: Extensive subcutaneous gas throughout the left arm and visible upper forearm. There  is no opaque foreign body or definite fracture (minimal cortical irregularity involving the medial aspect of the mid humeral diaphysis is likely overlapping subcutaneous gas).  IMPRESSION: Multiple lacerations throughout the left upper extremity. No opaque foreign body or definite fracture; recommend two-view imaging when clinically able.   Electronically Signed   By: Marnee SpringJonathon  Watts M.D.   On: 03/29/2014 22:47    Anti-infectives: Anti-infectives    Start     Dose/Rate Route Frequency Ordered Stop   03/30/14 0400  Ampicillin-Sulbactam (UNASYN) 3 g in sodium chloride 0.9 % 100 mL IVPB     3 g 100 mL/hr over 60 Minutes Intravenous Every 6 hours 03/30/14 0256     03/29/14 2200  ceFAZolin (ANCEF) IVPB 2 g/50 mL premix     2 g 100 mL/hr over 30 Minutes Intravenous  Once 03/29/14 2157 03/29/14 2228       Assessment/Plan: Multiple dog bites S/P repair mult lacs R hand, LUE, LLE - by Dr. Mina MarbleWeingold and Dr. Lindie SpruceWyatt, change outer dressings L radial artery injury - S/P ligation by Dr. Hart [name], hand perfused, follow dopplers ABL anemia - improved S/P TF ID - Unasyn empiric FEN - replace hypocalcemia, clears and advance as tolerated PT/OT VTE - PAS as tol, no Lovenox until Hb stable Dispo - to SDU  LOS: 0 days    Violeta GelinasBurke Crissy Mccreadie, MD, MPH, FACS Trauma: [phone] General Surgery: [phone]  03/30/2014

## 2014-03-30 NOTE — Progress Notes (Signed)
Patient states that her left arm feels like "it is not there." And is "heavy and numb." She has to use her right arm to pick up her left arm to move it. She is able to move her fingers slightly to command. She feels me touching her shoulder and feels me touching her fingers. Loud dopplerable pulse in palm. Will continue to closely monitor. Thresa RossA. Angle Karel RN

## 2014-03-30 NOTE — Anesthesia Postprocedure Evaluation (Signed)
Anesthesia Post Note  Patient: Allison [name]  Procedure(s) Performed: Procedure(s) (LRB): CLOSURE MULTIPLE LACERATIONS LEFT LEG AND Left ARM, Embolectomy., LIGATION OF RADIAL ARTERY (Left) IRRIGATION AND DEBRIDEMENT CLOSURE MULTIPLE LACERATIONS HAND AND ARM, lower legs (Left)  Anesthesia type: general  Patient location: PACU  Post pain: Pain level controlled  Post assessment: Patient's Cardiovascular Status Stable  Last Vitals:  Filed Vitals:   03/30/14 0545  BP: 127/76  Pulse: 87  Temp:   Resp: 16    Post vital signs: Reviewed and stable  Level of consciousness: sedated  Complications: No apparent anesthesia complications

## 2014-03-30 NOTE — Progress Notes (Signed)
Patient examined at bedside  Warm hand with normal gross sensation   Will do dressing change in OR 3/5

## 2014-03-30 NOTE — Op Note (Signed)
OPERATIVE REPORT  Date of Surgery: 03/29/2014  Surgeon: Josephina GipJames Lawson, MD  Assistant: Dr. [name]  Pre-op Diagnosis: multiple dog bites with vascular injury left upper extremity  Post-op Diagnosis: Extensive lacerations left upper extremity with a avulsion and transection left radial artery Procedure: Procedure(s): Exploration left brachial radial and ulnar artery with ligation radial artery at origin and distally, thrombectomy left radial artery  Anesthesia: General   Complications: None  Procedure Details: This patient was in the operating room with Drs. Megan MansJ Wyatt and Dr. [name] exploring multiple dog bites to the left lower extremity and left upper extremity. Dr. Mina MarbleWeingold well described the vein and soft tissue injuries. I was asked to evaluate the vascular supply. The patient had several transverse irregular lacerations in the skin. The brachial neurovascular bundle was exposed and the depth of the wound. Branches of the brachial vein were ligated with 3-0 silk ties and divided. Brachial artery was not injured it was dissected free down to its primary bifurcation. The ulnar branch was not injured. It was dissected down about 4-5 cm there was no evidence of any hematoma in the wall or other external injuries. There was excellent Doppler flow in the ulnar artery. The radial artery had been evulsed about 6 cm distal to its origin. There was some adventitial hematoma extending back to the origin of the vessel. The distal radial neurovascular bundle was identified and the radial vein ligated. The radial artery was transected and a 3 Fogarty catheter passed distally would easily go to the wrist level where it was palpated. Upon return some thrombus was retrieved followed by mildly pulsatile backbleeding. Blood pressure was only 85 systolic. Fogarty was passed proximally through the transected radial branch up into the brachial artery which significantly dilated the vessel and improved  flow down the ulnar branch. There was good Doppler flow in the ulnar artery at the wrist. There was no audible Doppler flow in the radial artery at the wrist. Size of the vessel-radial artery was about 1-1/2 mm and because of the extensive soft tissue injury and the fact that there was intact ulnar flow with good Doppler flow to the wrist it was not felt advisable to try to revascularize the small radial artery and this open wound. Dr. Mina MarbleWeingold concurred. Therefore we ligated the radial artery at its origin and distally and once again the patient had good ulnar flow at the wrist level. Weingold then concluded the procedure.   Josephina GipJames Lawson, MD 03/30/2014 12:51 AM

## 2014-03-31 MED ORDER — CIPROFLOXACIN IN D5W 400 MG/200ML IV SOLN
400.0000 mg | Freq: Two times a day (BID) | INTRAVENOUS | Status: DC
  Administered 2014-04-01 – 2014-04-03 (×5): 400 mg via INTRAVENOUS
  Filled 2014-03-31 (×6): qty 200

## 2014-03-31 MED ORDER — MORPHINE SULFATE 2 MG/ML IJ SOLN
2.0000 mg | INTRAMUSCULAR | Status: DC | PRN
  Administered 2014-03-31: 2 mg via INTRAVENOUS
  Administered 2014-03-31: 4 mg via INTRAVENOUS
  Administered 2014-04-02 (×2): 2 mg via INTRAVENOUS
  Filled 2014-03-31: qty 1
  Filled 2014-03-31: qty 2
  Filled 2014-03-31: qty 1
  Filled 2014-03-31: qty 2

## 2014-03-31 MED ORDER — BACITRACIN-NEOMYCIN-POLYMYXIN OINTMENT TUBE
TOPICAL_OINTMENT | CUTANEOUS | Status: DC
  Administered 2014-03-31: 1 via TOPICAL
  Filled 2014-03-31: qty 15

## 2014-03-31 MED ORDER — CHLORHEXIDINE GLUCONATE 4 % EX LIQD
60.0000 mL | Freq: Once | CUTANEOUS | Status: DC
  Filled 2014-03-31: qty 60

## 2014-03-31 MED ORDER — DIPHENHYDRAMINE HCL 50 MG/ML IJ SOLN
12.5000 mg | Freq: Once | INTRAMUSCULAR | Status: AC
  Administered 2014-03-31: 12.5 mg via INTRAVENOUS
  Filled 2014-03-31: qty 1

## 2014-03-31 MED ORDER — HYDROCODONE-ACETAMINOPHEN 5-325 MG PO TABS
1.0000 | ORAL_TABLET | ORAL | Status: DC | PRN
  Administered 2014-03-31 – 2014-04-01 (×5): 2 via ORAL
  Administered 2014-04-01 (×2): 1 via ORAL
  Administered 2014-04-01: 2 via ORAL
  Administered 2014-04-02: 1 via ORAL
  Administered 2014-04-02 – 2014-04-03 (×6): 2 via ORAL
  Filled 2014-03-31: qty 1
  Filled 2014-03-31: qty 2
  Filled 2014-03-31: qty 1
  Filled 2014-03-31 (×10): qty 2
  Filled 2014-03-31: qty 1
  Filled 2014-03-31: qty 2

## 2014-03-31 MED ORDER — DIPHENHYDRAMINE HCL 50 MG/ML IJ SOLN
25.0000 mg | Freq: Four times a day (QID) | INTRAMUSCULAR | Status: DC | PRN
  Administered 2014-03-31 (×2): 25 mg via INTRAVENOUS
  Filled 2014-03-31 (×2): qty 1

## 2014-03-31 NOTE — Progress Notes (Signed)
  [facility] Progress Note  03/31/2014 7:46 AM 2 Days Post-Op  Subjective: Hand feels a little better.     Filed Vitals:   03/31/14 0309  BP: 126/55  Pulse: 115  Temp: 99.3 F (37.4 C)  Resp: 15    Physical Exam: Incisions:  Left arm is dressed.  Extremities:  Doppler flow in left ulnar and palmar arch. Sensation intact left hand.   CBC    Component Value Date/Time   WBC 14.3* 03/30/2014 0500   RBC 3.57* 03/30/2014 0500   HGB 10.8* 03/30/2014 0500   HCT 31.1* 03/30/2014 0500   PLT 173 03/30/2014 0500   MCV 87.1 03/30/2014 0500   MCH 30.3 03/30/2014 0500   MCHC 34.7 03/30/2014 0500   RDW 14.1 03/30/2014 0500    BMET    Component Value Date/Time   NA 138 03/30/2014 0500   K 3.9 03/30/2014 0500   CL 110 03/30/2014 0500   CO2 20 03/30/2014 0500   GLUCOSE 127* 03/30/2014 0500   BUN 5* 03/30/2014 0500   CREATININE 0.77 03/30/2014 0500   CALCIUM 7.3* 03/30/2014 0500   GFRNONAA >90 03/30/2014 0500   GFRAA >90 03/30/2014 0500    INR    Component Value Date/Time   INR 1.31 03/30/2014 0500     Intake/Output Summary (Last 24 hours) at 03/31/14 0746 Last data filed at 03/31/14 0317  Gross per 24 hour  Intake   1100 ml  Output   1890 ml  Net   -790 ml     Assessment:  44 y.o. female is s/p: exploration of left brachial artery and ulnar artery with ligation radial artery at origin and distally, thrombectomy left radial artery  2 Days Post-Op  Plan: -Continuing to have doppler flow in left ulnar and palmer arch.  -For dressing change in OR tomorrow with Dr. Mina MarbleWeingold.  -Will continue to follow dopplers.    Maris BergerKimberly Lenvil Swaim, PA-C [facility] Office: [phone] Pager: [phone] 03/31/2014 7:46 AM

## 2014-03-31 NOTE — Progress Notes (Signed)
Occupational Therapy Treatment Patient Details Name: Allison [name] MRN: [id number] DOB: 10-25-1970 Today's Date: 03/31/2014    History of present illness Pt is a 44 y.o. female admitted after dog bite at home with multiple lacerations to LUE, LLE, R hand, s/p Exploration left brachial radial and ulnar artery with ligation radial artery at origin and distally, thrombectomy left radial artery   OT comments  Pt seen today for therapeutic exercise of LUE and functional mobility. Due to pain, pt is not agreeable to get OOB and has not been up since yesterday when she became lethargic and nauseous. Encouraged pt to get OOB today with PT. Pt is due for OR tomorrow for LUE. Provided gentle PROM and educated pt and husband on progression of rehab. OT will continue to monitor. Continue to recommend OPOT, however will determine if higher level of care is needed after mobility is assessed.    Follow Up Recommendations  Outpatient OT;Other (comment) (progress rehab of LUE as directed by MD) (will monitor as mobility is assessed)   Equipment Recommendations  Other (comment)    Recommendations for Other Services      Precautions / Restrictions Precautions Precautions: Fall       Mobility Bed Mobility               General bed mobility comments: Pt in bed and not agreeable to OOB at this time.   Transfers                 General transfer comment: Pt not agreeable for OOB due to LUE pain today.         ADL Overall ADL's : Needs assistance/impaired                                       General ADL Comments: Pt in extreme pain in LUE and due for OR tomorrow. She was agreeable to LUE ROM despite pain but not OOB at this time. Pt has not been up since yesterday when she became light headed and nauseous. Encouraged pt to get OOB with PT today.                Cognition  Arousal/Alertness: Awake/Alert Behavior During Therapy: WFL for tasks  assessed/performed Overall Cognitive Status: Within Functional Limits for tasks assessed                         Exercises Other Exercises Other Exercises: provided gentle PROM to LUE hand within tolerance. MCPs with edema and very minimal movement, however gently increased ROM of DIP and PIP through passive flexion and extension. ROM increased from yesterday. Educated husband on very gentle PROM and he reports pt has been moving fingers to tolerance.            Pertinent Vitals/ Pain       Pain Assessment: Faces Faces Pain Scale: Hurts whole lot Pain Location: LUE Pain Descriptors / Indicators: Aching;Sharp;Pressure;Numbness Pain Intervention(s): Limited activity within patient's tolerance;Monitored during session;Premedicated before session;Repositioned         Frequency Min 3X/week     Progress Toward Goals  OT Goals(current goals can now be found in the care plan section)  Progress towards OT goals: Progressing toward goals (slowly due to pain)  Acute Rehab OT Goals Patient Stated Goal: return to work and family OT Goal Formulation: With patient Time For Goal Achievement: 04/13/14  Potential to Achieve Goals: Good  Plan Discharge plan remains appropriate       End of Session     Activity Tolerance Patient limited by pain   Patient Left in bed;with call bell/phone within reach   Nurse Communication          Time: 1040-1050 OT Time Calculation (min): 10 min  Charges: OT General Charges $OT Visit: 1 Procedure OT Treatments $Therapeutic Exercise: 8-22 mins  Allison [name] 03/31/2014, 12:31 PM   Allison [name], OTR/L Occupational Therapist [phone] (pager)

## 2014-03-31 NOTE — Progress Notes (Signed)
Physical Therapy Treatment Patient Details Name: Allison [name] MRN: [id number] DOB: 17-Apr-1970 Today's Date: 03/31/2014    History of Present Illness Pt is a 44 y.o. female admitted after dog bite at home with multiple lacerations to LUE, LLE, R hand, s/p Exploration left brachial radial and ulnar artery with ligation radial artery at origin and distally, thrombectomy left radial artery    PT Comments    Patient progressing towards physical therapy goals although limited by pain, dizziness, and HR of 135 upon standing today. Unable to tolerate gait training at this time. Will progress as able. Patient will continue to benefit from skilled physical therapy services to further improve independence with functional mobility.   Follow Up Recommendations  Outpatient PT     Equipment Recommendations  None recommended by PT    Recommendations for Other Services OT consult     Precautions / Restrictions Precautions Precautions: Fall    Mobility  Bed Mobility Overal bed mobility: Needs Assistance Bed Mobility: Supine to Sit;Sit to Supine     Supine to sit: Min assist Sit to supine: Min guard   General bed mobility comments: Min assist for trunk support to edge of bed. Cues for technique. Close guard to lie back down.   Transfers Overall transfer level: Needs assistance Equipment used: 1 person hand held assist Transfers: Sit to/from Stand Sit to Stand: Min assist         General transfer comment: Min assist for boost to stand from lowest bed setting. Cues for hand placement. Complains of feeling lightheaded (see vitals below). HR elevated quickly to 135.  Ambulation/Gait                 Stairs            Wheelchair Mobility    Modified Rankin (Stroke Patients Only)       Balance           Standing balance support: Single extremity supported Standing balance-Leahy Scale: Poor                      Cognition Arousal/Alertness:  Awake/alert Behavior During Therapy: WFL for tasks assessed/performed Overall Cognitive Status: Within Functional Limits for tasks assessed                      Exercises General Exercises - Lower Extremity Ankle Circles/Pumps: AROM;Both;10 reps;Supine Other Exercises Other Exercises: provided gentle PROM to LUE hand within tolerance. MCPs with edema and very minimal movement, however gently increased ROM of DIP and PIP through passive flexion and extension. ROM increased from yesterday. Educated husband on very gentle PROM and he reports pt has been moving fingers to tolerance.     General Comments General comments (skin integrity, edema, etc.): BP taken in sitting on RLE, 134/104, once supine BP 132/55.[name]. SpO2 100% on room air      Pertinent Vitals/Pain Pain Assessment: Faces Faces Pain Scale: Hurts whole lot Pain Location: Lt side Pain Descriptors / Indicators: Aching;Numbness Pain Intervention(s): Limited activity within patient's tolerance;Monitored during session;Repositioned    Home Living                      Prior Function            PT Goals (current goals can now be found in the care plan section) Acute Rehab PT Goals Patient Stated Goal: return to work and family PT Goal Formulation: With patient Time For Goal Achievement: 04/06/14  Potential to Achieve Goals: Good Progress towards PT goals: Progressing toward goals    Frequency  Min 4X/week    PT Plan Current plan remains appropriate    Co-evaluation             End of Session Equipment Utilized During Treatment: Gait belt Activity Tolerance: Patient limited by pain;Treatment limited secondary to medical complications (Comment) (HR elevated to 135 upon standing) Patient left: in bed;with call bell/phone within reach;with family/visitor present     Time: 1610-9604 PT Time Calculation (min) (ACUTE ONLY): 22 min  Charges:  $Therapeutic Activity: 8-22 mins                    G  Codes:      [name] 2014/04/11, 2:03 PM [name], [name] [phone]

## 2014-03-31 NOTE — Progress Notes (Signed)
Patients husband concerned about antibiotic being given. Per husband, every time patient gets Unasyn she becomes itchy 1-2 hrs later. States that he has been trying to get this addressed and we are not listening. Evening dose of antibiotic given and pt does not report any itching. Dr. Luisa Hartornett with trauma services paged and situation was explained. Per MD ok to stop current antibiotics and begin cipro. Spoke to husband and patient regarding MD orders and agreeable to plan.

## 2014-03-31 NOTE — Progress Notes (Signed)
CSW completed SBIRT.  No f/u necessary. CSW also spoke with pt's husband and offered emotional support.  Pt resting with eyes closed/non-talkative for most of visit.  Trauma CSW will continue to follow for d/c planning.

## 2014-03-31 NOTE — Progress Notes (Signed)
2 Days Post-Op  Subjective: Pt with some itching with pain meds, pain controlled.  Poor appetite.  Objective: Vital signs in last 24 hours: Temp:  [98.3 F (36.8 C)-99.3 F (37.4 C)] 99.3 F (37.4 C) (03/04 0309) Pulse Rate:  [82-115] 115 (03/04 0309) Resp:  [12-20] 15 (03/04 0309) BP: (101-127)/(55-83) 126/55 mmHg (03/04 0309) SpO2:  [94 %-100 %] 100 % (03/04 0309)    Intake/Output from previous day: 03/03 0701 - 03/04 0700 In: 1100 [I.V.:900; IV Piggyback:200] Out: 1890 [Urine:1890] Intake/Output this shift:    General appearance: alert and cooperative Cardio: regular rate and rhythm, S1, S2 normal, no murmur, click, rub or gallop GI: soft, non-tender; bowel sounds normal; no masses,  no organomegaly Incision/Wound: LLE wound clean and dry, no erythema or drainage  Lab Results:   Recent Labs  03/29/14 2205  03/30/14 0042 03/30/14 0500  WBC 34.0*  --   --  14.3*  HGB 9.9*  < > 7.8* 10.8*  HCT 29.2*  < > 23.0* 31.1*  PLT 289  --   --  173  < > = values in this interval not displayed. BMET  Recent Labs  03/29/14 2205 03/29/14 2218  03/30/14 0042 03/30/14 0500  NA 138 139  < > 139 138  K 3.7 3.5  < > 4.2 3.9  CL 111 107  --   --  110  CO2 20  --   --   --  20  GLUCOSE 275* 266*  < > 188* 127*  BUN <5* 4*  --   --  5*  CREATININE 0.96 0.90  --   --  0.77  CALCIUM 7.4*  --   --   --  7.3*  < > = values in this interval not displayed. PT/INR  Recent Labs  03/29/14 2205 03/30/14 0500  LABPROT 15.7* 16.5*  INR 1.23 1.31   ABG No results for input(s): PHART, HCO3 in the last 72 hours.  Invalid input(s): PCO2, PO2  Studies/Results: Dg Forearm Left  03/29/2014   CLINICAL DATA:  Attacked by dog sustaining multiple soft tissue injuries of left arm.  EXAM: LEFT FOREARM - 2 VIEW  COMPARISON:  None.  FINDINGS: The left forearm demonstrates massive soft tissue injury, particularly involving the proximal forearm and antecubital region. Soft tissue gas is  present. No evidence of fracture or soft tissue foreign body.  IMPRESSION: Massive soft tissue injury to the left forearm without visible underlying foreign body or fracture of bone.   Electronically Signed   By: Irish Lack M.D.   On: 03/29/2014 22:34   Dg Chest Portable 1 View  03/29/2014   CLINICAL DATA:  Trauma.  Attacked by dog.  EXAM: PORTABLE CHEST - 1 VIEW  COMPARISON:  None.  FINDINGS: The heart size and mediastinal contours are within normal limits. There is no evidence of pulmonary edema, consolidation, pneumothorax, nodule or pleural fluid. The visualized skeletal structures are unremarkable.  IMPRESSION: No active disease.   Electronically Signed   By: Irish Lack M.D.   On: 03/29/2014 22:29   Dg Humerus Left  03/29/2014   CLINICAL DATA:  Multiple dog bites to the left arm. Initial encounter.  EXAM: LEFT HUMERUS - 1+ VIEW  COMPARISON:  None.  FINDINGS: Extensive subcutaneous gas throughout the left arm and visible upper forearm. There is no opaque foreign body or definite fracture (minimal cortical irregularity involving the medial aspect of the mid humeral diaphysis is likely overlapping subcutaneous gas).  IMPRESSION: Multiple lacerations  throughout the left upper extremity. No opaque foreign body or definite fracture; recommend two-view imaging when clinically able.   Electronically Signed   By: Marnee SpringJonathon  Watts M.D.   On: 03/29/2014 22:47    Anti-infectives: Anti-infectives    Start     Dose/Rate Route Frequency Ordered Stop   03/30/14 0400  Ampicillin-Sulbactam (UNASYN) 3 g in sodium chloride 0.9 % 100 mL IVPB     3 g 100 mL/hr over 60 Minutes Intravenous Every 6 hours 03/30/14 0256     03/29/14 2200  ceFAZolin (ANCEF) IVPB 2 g/50 mL premix     2 g 100 mL/hr over 30 Minutes Intravenous  Once 03/29/14 2157 03/29/14 2228      Assessment/Plan: Multiple dog bites S/P repair mult lacs R hand, LUE, LLE - by Dr. Mina MarbleWeingold to OR in AM for LUE dressing change.Dressing change to  LLE QOD with triple abx ointment Change on Sunday L radial artery injury - S/P ligation by Dr. Hart [name], hand perfused, follow dopplers ABL anemia - improved S/P TF ID - Unasyn empiric FEN - CLD, NPO at [facility] for [facility] PT/OT VTE - PAS as tol, no Lovenox until Hb stable Dispo - to SDU  LOS: 1 day    Allison EhlersRamirez Jr., Allison [name] 03/31/2014

## 2014-03-31 NOTE — Progress Notes (Signed)
Patient ID: Allison [name], female   DOB: 12-03-1970, 44 y.o.   MRN: [id number] Vascular Surgery Progress Note  Subjective: One day post dog bite injury to left upper and lower extremity. Patient continues to complain of significant discomfort left upper extremity. Dressing changed by nursing staff yesterday . Scheduled to return to the OR either today or tomorrow  Objective:  Filed Vitals:   03/31/14 0309  BP: 126/55  Pulse: 115  Temp: 99.3 F (37.4 C)  Resp: 15    Good capillary filling left hand. Good Doppler flow left ulnar artery and palmar arch   Labs:  Recent Labs Lab 03/29/14 2205 03/29/14 2218 03/30/14 0500  CREATININE 0.96 0.90 0.77    Recent Labs Lab 03/29/14 2205 03/29/14 2218 03/29/14 2341 03/30/14 0042 03/30/14 0500  NA 138 139 139 139 138  K 3.7 3.5 3.8 4.2 3.9  CL 111 107  --   --  110  CO2 20  --   --   --  20  BUN <5* 4*  --   --  5*  CREATININE 0.96 0.90  --   --  0.77  GLUCOSE 275* 266* 208* 188* 127*  CALCIUM 7.4*  --   --   --  7.3*    Recent Labs Lab 03/29/14 2205  03/29/14 2341 03/30/14 0042 03/30/14 0500  WBC 34.0*  --   --   --  14.3*  HGB 9.9*  < > 6.1* 7.8* 10.8*  HCT 29.2*  < > 18.0* 23.0* 31.1*  PLT 289  --   --   --  173  < > = values in this interval not displayed.  Recent Labs Lab 03/29/14 2205 03/30/14 0500  INR 1.23 1.31    I/O last 3 completed shifts: In: 6126.3 [I.V.:4606.3; Blood:670; Other:50; IV Piggyback:800] Out: 2645 [Urine:2645]  Imaging: Dg Forearm Left  03/29/2014   CLINICAL DATA:  Attacked by dog sustaining multiple soft tissue injuries of left arm.  EXAM: LEFT FOREARM - 2 VIEW  COMPARISON:  None.  FINDINGS: The left forearm demonstrates massive soft tissue injury, particularly involving the proximal forearm and antecubital region. Soft tissue gas is present. No evidence of fracture or soft tissue foreign body.  IMPRESSION: Massive soft tissue injury to the left forearm without visible underlying  foreign body or fracture of bone.   Electronically Signed   By: Irish Lack M.D.   On: 03/29/2014 22:34   Dg Chest Portable 1 View  03/29/2014   CLINICAL DATA:  Trauma.  Attacked by dog.  EXAM: PORTABLE CHEST - 1 VIEW  COMPARISON:  None.  FINDINGS: The heart size and mediastinal contours are within normal limits. There is no evidence of pulmonary edema, consolidation, pneumothorax, nodule or pleural fluid. The visualized skeletal structures are unremarkable.  IMPRESSION: No active disease.   Electronically Signed   By: Irish Lack M.D.   On: 03/29/2014 22:29   Dg Humerus Left  03/29/2014   CLINICAL DATA:  Multiple dog bites to the left arm. Initial encounter.  EXAM: LEFT HUMERUS - 1+ VIEW  COMPARISON:  None.  FINDINGS: Extensive subcutaneous gas throughout the left arm and visible upper forearm. There is no opaque foreign body or definite fracture (minimal cortical irregularity involving the medial aspect of the mid humeral diaphysis is likely overlapping subcutaneous gas).  IMPRESSION: Multiple lacerations throughout the left upper extremity. No opaque foreign body or definite fracture; recommend two-view imaging when clinically able.   Electronically Signed   By: Marja Kays  Watts M.D.   On: 03/29/2014 22:47    Assessment/Plan:  POD #1  LOS: 1 day  s/p Procedure(s): CLOSURE MULTIPLE LACERATIONS LEFT LEG AND Left ARM, Embolectomy., LIGATION OF RADIAL ARTERY IRRIGATION AND DEBRIDEMENT CLOSURE MULTIPLE LACERATIONS HAND AND ARM, lower legs  Doing satisfactorily from vascular surgery standpoint. Patient has in-line flow through ulnar artery to wrist and good filling of palmar arch Patient to return to OR today or tomorrow for further inspection of wound and dressing change  Nothing further to offer from vascular standpoint. I will be out of town through weekend and Dr. Myra GianottiBrabham will be available if any questions regarding her vascular supply. Discussed the situation with patient and her husband and  their questions were answered.   Josephina GipJames Aryaman Haliburton, MD 03/31/2014 8:14 AM

## 2014-04-01 ENCOUNTER — Encounter (HOSPITAL_COMMUNITY): Admission: EM | Disposition: A | Discharge status: Home Health | Payer: Self-pay | Source: Home / Self Care

## 2014-04-01 ENCOUNTER — Inpatient Hospital Stay (HOSPITAL_COMMUNITY): Payer: 59 | Admitting: Certified Registered Nurse Anesthetist

## 2014-04-01 HISTORY — PX: DRESSING CHANGE UNDER ANESTHESIA: SHX5237

## 2014-04-01 LAB — GRAM STAIN

## 2014-04-01 LAB — CBC
HCT: 27.3 % — ABNORMAL LOW (ref 36.0–46.0)
Hemoglobin: 9.2 g/dL — ABNORMAL LOW (ref 12.0–15.0)
MCH: 30.3 pg (ref 26.0–34.0)
MCHC: 33.7 g/dL (ref 30.0–36.0)
MCV: 89.8 fL (ref 78.0–100.0)
Platelets: 183 10*3/uL (ref 150–400)
RBC: 3.04 MIL/uL — AB (ref 3.87–5.11)
RDW: 14.2 % (ref 11.5–15.5)
WBC: 16.3 10*3/uL — ABNORMAL HIGH (ref 4.0–10.5)

## 2014-04-01 SURGERY — REPLACEMENT, DRESSING, WITH ANESTHESIA
Anesthesia: General | Laterality: Left

## 2014-04-01 SURGERY — Surgical Case
Anesthesia: *Unknown

## 2014-04-01 MED ORDER — LACTATED RINGERS IV SOLN
INTRAVENOUS | Status: DC | PRN
  Administered 2014-04-01: 07:00:00 via INTRAVENOUS

## 2014-04-01 MED ORDER — LIDOCAINE HCL (CARDIAC) 20 MG/ML IV SOLN
INTRAVENOUS | Status: DC | PRN
  Administered 2014-04-01: 40 mg via INTRAVENOUS

## 2014-04-01 MED ORDER — CLINDAMYCIN HCL 150 MG PO CAPS
150.0000 mg | ORAL_CAPSULE | Freq: Four times a day (QID) | ORAL | Status: DC
Start: 2014-04-01 — End: 2014-04-03
  Administered 2014-04-01 – 2014-04-03 (×8): 150 mg via ORAL
  Filled 2014-04-01 (×13): qty 1

## 2014-04-01 MED ORDER — SUCCINYLCHOLINE CHLORIDE 20 MG/ML IJ SOLN
INTRAMUSCULAR | Status: AC
  Filled 2014-04-01: qty 1

## 2014-04-01 MED ORDER — ONDANSETRON HCL 4 MG/2ML IJ SOLN
INTRAMUSCULAR | Status: AC
  Filled 2014-04-01: qty 2

## 2014-04-01 MED ORDER — ARTIFICIAL TEARS OP OINT
TOPICAL_OINTMENT | OPHTHALMIC | Status: AC
  Filled 2014-04-01: qty 3.5

## 2014-04-01 MED ORDER — MIDAZOLAM HCL 5 MG/5ML IJ SOLN
INTRAMUSCULAR | Status: DC | PRN
  Administered 2014-04-01: 1 mg via INTRAVENOUS

## 2014-04-01 MED ORDER — PROPOFOL 10 MG/ML IV BOLUS
INTRAVENOUS | Status: DC | PRN
  Administered 2014-04-01: 120 mg via INTRAVENOUS

## 2014-04-01 MED ORDER — HYDROMORPHONE HCL 1 MG/ML IJ SOLN: 0.2500 mg | INTRAMUSCULAR | Status: DC | PRN

## 2014-04-01 MED ORDER — FENTANYL CITRATE 0.05 MG/ML IJ SOLN
INTRAMUSCULAR | Status: AC
  Filled 2014-04-01: qty 5

## 2014-04-01 MED ORDER — FENTANYL CITRATE 0.05 MG/ML IJ SOLN
INTRAMUSCULAR | Status: DC | PRN
  Administered 2014-04-01: 100 ug via INTRAVENOUS

## 2014-04-01 MED ORDER — LEVOTHYROXINE SODIUM 50 MCG PO TABS
150.0000 ug | ORAL_TABLET | Freq: Every day | ORAL | Status: DC
  Administered 2014-04-02 – 2014-04-03 (×2): 150 ug via ORAL
  Filled 2014-04-01 (×4): qty 1

## 2014-04-01 MED ORDER — MIDAZOLAM HCL 2 MG/2ML IJ SOLN
INTRAMUSCULAR | Status: AC
  Filled 2014-04-01: qty 2

## 2014-04-01 SURGICAL SUPPLY — 51 items
BANDAGE ELASTIC 3 VELCRO ST LF (GAUZE/BANDAGES/DRESSINGS) ×3 IMPLANT
BANDAGE ELASTIC 4 VELCRO ST LF (GAUZE/BANDAGES/DRESSINGS) ×3 IMPLANT
BNDG CMPR 9X4 STRL LF SNTH (GAUZE/BANDAGES/DRESSINGS)
BNDG CONFORM 2 STRL LF (GAUZE/BANDAGES/DRESSINGS) IMPLANT
BNDG ESMARK 4X9 LF (GAUZE/BANDAGES/DRESSINGS) IMPLANT
BNDG GAUZE ELAST 4 BULKY (GAUZE/BANDAGES/DRESSINGS) ×6 IMPLANT
CORDS BIPOLAR (ELECTRODE) IMPLANT
COVER SURGICAL LIGHT HANDLE (MISCELLANEOUS) ×6 IMPLANT
CUFF TOURNIQUET SINGLE 18IN (TOURNIQUET CUFF) ×3 IMPLANT
DECANTER SPIKE VIAL GLASS SM (MISCELLANEOUS) ×3 IMPLANT
DRAIN PENROSE 1/4X12 LTX STRL (WOUND CARE) IMPLANT
DRAPE SURG 17X23 STRL (DRAPES) ×3 IMPLANT
DRSG PAD ABDOMINAL 8X10 ST (GAUZE/BANDAGES/DRESSINGS) ×6 IMPLANT
DURAPREP 26ML APPLICATOR (WOUND CARE) IMPLANT
ELECT REM PT RETURN 9FT ADLT (ELECTROSURGICAL)
ELECTRODE REM PT RTRN 9FT ADLT (ELECTROSURGICAL) IMPLANT
GAUZE PACKING IODOFORM 1/4X5 (PACKING) IMPLANT
GAUZE SPONGE 4X4 12PLY STRL (GAUZE/BANDAGES/DRESSINGS) ×6 IMPLANT
GAUZE XEROFORM 1X8 LF (GAUZE/BANDAGES/DRESSINGS) ×3 IMPLANT
GAUZE XEROFORM 5X9 LF (GAUZE/BANDAGES/DRESSINGS) ×3 IMPLANT
GLOVE SURG SYN 8.0 (GLOVE) ×3 IMPLANT
GOWN STRL REUS W/ TWL LRG LVL3 (GOWN DISPOSABLE) ×1 IMPLANT
GOWN STRL REUS W/ TWL XL LVL3 (GOWN DISPOSABLE) ×1 IMPLANT
GOWN STRL REUS W/TWL LRG LVL3 (GOWN DISPOSABLE) ×3
GOWN STRL REUS W/TWL XL LVL3 (GOWN DISPOSABLE) ×3
HANDPIECE INTERPULSE COAX TIP (DISPOSABLE)
KIT BASIN OR (CUSTOM PROCEDURE TRAY) ×3 IMPLANT
KIT ROOM TURNOVER OR (KITS) ×3 IMPLANT
MANIFOLD NEPTUNE II (INSTRUMENTS) ×3 IMPLANT
NEEDLE HYPO 25GX1X1/2 BEV (NEEDLE) IMPLANT
NEEDLE HYPO 25X1 1.5 SAFETY (NEEDLE) ×3 IMPLANT
NS IRRIG 1000ML POUR BTL (IV SOLUTION) ×3 IMPLANT
PACK ORTHO EXTREMITY (CUSTOM PROCEDURE TRAY) ×3 IMPLANT
PAD ARMBOARD 7.5X6 YLW CONV (MISCELLANEOUS) ×6 IMPLANT
PAD CAST 4YDX4 CTTN HI CHSV (CAST SUPPLIES) ×2 IMPLANT
PADDING CAST COTTON 4X4 STRL (CAST SUPPLIES) ×6
SET HNDPC FAN SPRY TIP SCT (DISPOSABLE) IMPLANT
SPONGE GAUZE 4X4 12PLY STER LF (GAUZE/BANDAGES/DRESSINGS) ×3 IMPLANT
SPONGE LAP 18X18 X RAY DECT (DISPOSABLE) ×3 IMPLANT
SUCTION FRAZIER TIP 10 FR DISP (SUCTIONS) ×3 IMPLANT
SUT VICRYL RAPIDE 4/0 PS 2 (SUTURE) IMPLANT
SYR 20CC LL (SYRINGE) IMPLANT
SYR CONTROL 10ML LL (SYRINGE) ×3 IMPLANT
TOWEL OR 17X24 6PK STRL BLUE (TOWEL DISPOSABLE) ×3 IMPLANT
TOWEL OR 17X26 10 PK STRL BLUE (TOWEL DISPOSABLE) ×3 IMPLANT
TUBE ANAEROBIC SPECIMEN COL (MISCELLANEOUS) IMPLANT
TUBE CONNECTING 12'X1/4 (SUCTIONS) ×1
TUBE CONNECTING 12X1/4 (SUCTIONS) ×2 IMPLANT
UNDERPAD 30X30 INCONTINENT (UNDERPADS AND DIAPERS) ×3 IMPLANT
WATER STERILE IRR 1000ML POUR (IV SOLUTION) ×3 IMPLANT
YANKAUER SUCT BULB TIP NO VENT (SUCTIONS) ×3 IMPLANT

## 2014-04-01 NOTE — Progress Notes (Signed)
Received orders to transfer pt to 6N, pt and pt's family aware of transfer. No s/s of acute distress noted, VS stable. Report called to RN on 6N. Will transfer pt after pt finishes lunch.

## 2014-04-01 NOTE — Op Note (Signed)
See note [phone]

## 2014-04-01 NOTE — Transfer of Care (Signed)
Immediate Anesthesia Transfer of Care Note  Patient: Allison [name]  Procedure(s) Performed: Procedure(s): DRESSING CHANGE LEFT UPPER EXTERMITY AND LEFT HAND UNDER ANESTHESIA (Left)  Patient Location: PACU  Anesthesia Type:General  Level of Consciousness: awake, alert  and sedated  Airway & Oxygen Therapy: Patient Spontanous Breathing  Post-op Assessment: Report given to RN, Post -op Vital signs reviewed and stable and Patient moving all extremities  Post vital signs: Reviewed and stable  Last Vitals:  Filed Vitals:   04/01/14 0356  BP: 146/66  Pulse: 109  Temp: 37.1 C  Resp: 13    Complications: No apparent anesthesia complications

## 2014-04-01 NOTE — Anesthesia Postprocedure Evaluation (Signed)
  Anesthesia Post-op Note  Patient: Allison [name]  Procedure(s) Performed: Procedure(s): DRESSING CHANGE LEFT UPPER EXTERMITY AND LEFT HAND UNDER ANESTHESIA (Left)  Patient Location: PACU  Anesthesia Type:General  Level of Consciousness: awake and alert   Airway and Oxygen Therapy: Patient Spontanous Breathing  Post-op Pain: none  Post-op Assessment: Post-op Vital signs reviewed, Patient's Cardiovascular Status Stable and Respiratory Function Stable  Post-op Vital Signs: Reviewed  Filed Vitals:   04/01/14 0815  BP: 163/75  Pulse: 90  Temp:   Resp: 16    Complications: No apparent anesthesia complications

## 2014-04-01 NOTE — Progress Notes (Signed)
Day of Surgery  Subjective: Pt doing well today.  S/p dressing change in OR of LUE  Objective: Vital signs in last 24 hours: Temp:  [97.5 F (36.4 C)-99.5 F (37.5 C)] 98.6 F (37 C) (03/05 0830) Pulse Rate:  [90-115] 94 (03/05 0830) Resp:  [12-18] 17 (03/05 0854) BP: (122-163)/(52-78) 126/75 mmHg (03/05 0854) SpO2:  [97 %-100 %] 99 % (03/05 0854) Last BM Date: 03/29/14  Intake/Output from previous day: 03/04 0701 - 03/05 0700 In: 2235 [P.O.:660; I.V.:1275; IV Piggyback:300] Out: 2800 [Urine:2800] Intake/Output this shift: Total I/O In: 850 [I.V.:850] Out: 50 [Urine:50]  General appearance: alert and cooperative GI: soft, non-tender; bowel sounds normal; no masses,  no organomegaly Incision/Wound: RUE hand wound c/d/i, LUE/LLE wrapped  Lab Results:   Recent Labs  03/30/14 0500 04/01/14 0253  WBC 14.3* 16.3*  HGB 10.8* 9.2*  HCT 31.1* 27.3*  PLT 173 183   BMET  Recent Labs  03/29/14 2205 03/29/14 2218  03/30/14 0042 03/30/14 0500  NA 138 139  < > 139 138  K 3.7 3.5  < > 4.2 3.9  CL 111 107  --   --  110  CO2 20  --   --   --  20  GLUCOSE 275* 266*  < > 188* 127*  BUN <5* 4*  --   --  5*  CREATININE 0.96 0.90  --   --  0.77  CALCIUM 7.4*  --   --   --  7.3*  < > = values in this interval not displayed. PT/INR  Recent Labs  03/29/14 2205 03/30/14 0500  LABPROT 15.7* 16.5*  INR 1.23 1.31   ABG No results for input(s): PHART, HCO3 in the last 72 hours.  Invalid input(s): PCO2, PO2  Studies/Results: No results found.  Anti-infectives: Anti-infectives    Start     Dose/Rate Route Frequency Ordered Stop   04/01/14 0000  ciprofloxacin (CIPRO) IVPB 400 mg     400 mg 200 mL/hr over 60 Minutes Intravenous Every 12 hours 03/31/14 2323     03/30/14 0400  Ampicillin-Sulbactam (UNASYN) 3 g in sodium chloride 0.9 % 100 mL IVPB  Status:  Discontinued     3 g 100 mL/hr over 60 Minutes Intravenous Every 6 hours 03/30/14 0256 03/31/14 2323   03/29/14 2200   ceFAZolin (ANCEF) IVPB 2 g/50 mL premix     2 g 100 mL/hr over 30 Minutes Intravenous  Once 03/29/14 2157 03/29/14 2228      Assessment/Plan: Multiple dog bites S/P repair mult lacs R hand, LUE, LLE - per Dr. Mina MarbleWeingold. Dressing change to LLE QOD with triple abx ointment Change on Sunday L radial artery injury - S/P ligation by Dr. Hart [name], hand perfused, follow dopplers ABL anemia - improved S/P TF ID - Cipro empiric FEN - CLD, NPO at [facility] for OR PT/OT VTE - PAS/Lovenox Dispo - to SDU   LOS: 2 days    Marigene Ehlersamirez Jr., [facility] 04/01/2014

## 2014-04-01 NOTE — Progress Notes (Signed)
Pt arrived to unit from PACU accompanied by RN. VS stable, no s/s of acute distress noted. Pt re-oriented to room/unit.

## 2014-04-01 NOTE — Anesthesia Preprocedure Evaluation (Signed)
Anesthesia Evaluation  Patient identified by MRN, date of birth, ID band Patient awake    Reviewed: Allergy & Precautions, H&P , NPO status , Patient's Chart, lab work & pertinent test results  Airway Mallampati: II  TM Distance: >3 FB Neck ROM: Full    Dental no notable dental hx. (+) Teeth Intact, Dental Advisory Given   Pulmonary Current Smoker,  breath sounds clear to auscultation  Pulmonary exam normal       Cardiovascular negative cardio ROS  Rhythm:Regular Rate:Normal     Neuro/Psych negative neurological ROS  negative psych ROS   GI/Hepatic negative GI ROS, Neg liver ROS,   Endo/Other  Hypothyroidism   Renal/GU negative Renal ROS  negative genitourinary   Musculoskeletal   Abdominal   Peds  Hematology negative hematology ROS (+)   Anesthesia Other Findings   Reproductive/Obstetrics negative OB ROS                             Anesthesia Physical Anesthesia Plan  ASA: II  Anesthesia Plan: General   Post-op Pain Management:    Induction: Intravenous  Airway Management Planned: LMA  Additional Equipment:   Intra-op Plan:   Post-operative Plan: Extubation in OR  Informed Consent: I have reviewed the patients History and Physical, chart, labs and discussed the procedure including the risks, benefits and alternatives for the proposed anesthesia with the patient or authorized representative who has indicated his/her understanding and acceptance.   Dental advisory given  Plan Discussed with: CRNA  Anesthesia Plan Comments:         Anesthesia Quick Evaluation

## 2014-04-01 NOTE — Progress Notes (Signed)
Patient underwent dressing change under anesthesia this am  All wounds with good granulation tissue and no signs of infection serous drainage sent for gram stain and cultures  Will need BID wet to dry dressing changes starting 3/6  OT to fabricate volar wrist splint in slight extension on 3/6  Will need to see in my office this week

## 2014-04-01 NOTE — H&P (View-Only) (Signed)
Reason for Consult:complex dog bites to left upper extremity Referring Physician: [name] is an 44 y.o. female.  HPI: s/p attack by own dog with multiple complex left upper extremity lacerations  No past medical history on file.  No past surgical history on file.  No family history on file.  Social History:  has no tobacco, alcohol, and drug history on file.  Allergies: Not on File  Medications: Scheduled:  Results for orders placed or performed during the hospital encounter of 03/29/14 (from the past 48 hour(s))  Prepare fresh frozen plasma     Status: None (Preliminary result)   Collection Time: 03/29/14  9:26 PM  Result Value Ref Range   Unit Number [id number]    Blood Component Type LIQ PLASMA    Unit division 00    Status of Unit ISSUED    Unit tag comment VERBAL ORDERS PER DR RANCOUR    Transfusion Status OK TO TRANSFUSE    Unit Number [id number]    Blood Component Type THW PLS APHR    Unit division 00    Status of Unit ISSUED    Unit tag comment VERBAL ORDERS PER DR Manus Gunning    Transfusion Status OK TO TRANSFUSE   Type and screen     Status: None (Preliminary result)   Collection Time: 03/29/14 10:05 PM  Result Value Ref Range   ABO/RH(D) O POS    Antibody Screen PENDING    Sample Expiration 04/01/2014    Unit Number [id number]    Blood Component Type RBC LR PHER2    Unit division 00    Status of Unit ISSUED    Unit tag comment VERBAL ORDERS PER DR RANCOUR    Transfusion Status OK TO TRANSFUSE    Crossmatch Result PENDING    Unit Number [id number]    Blood Component Type RED CELLS,LR    Unit division 00    Status of Unit ISSUED    Unit tag comment VERBAL ORDERS PER DR RANCOUR    Transfusion Status OK TO TRANSFUSE    Crossmatch Result PENDING   I-Stat Chem 8, ED     Status: Abnormal   Collection Time: 03/29/14 10:18 PM  Result Value Ref Range   Sodium 139 135 - 145 mmol/L   Potassium 3.5 3.5 - 5.1 mmol/L   Chloride 107 96  - 112 mmol/L   BUN 4 (L) 6 - 23 mg/dL   Creatinine, Ser 7.25 0.50 - 1.10 mg/dL   Glucose, Bld 366 (H) 70 - 99 mg/dL   Calcium, Ion 4.40 (L) 1.12 - 1.23 mmol/L   TCO2 13 0 - 100 mmol/L   Hemoglobin 10.2 (L) 12.0 - 15.0 g/dL   HCT 34.7 (L) 42.5 - 95.6 %    Dg Chest Portable 1 View  03/29/2014   CLINICAL DATA:  Trauma.  Attacked by dog.  EXAM: PORTABLE CHEST - 1 VIEW  COMPARISON:  None.  FINDINGS: The heart size and mediastinal contours are within normal limits. There is no evidence of pulmonary edema, consolidation, pneumothorax, nodule or pleural fluid. The visualized skeletal structures are unremarkable.  IMPRESSION: No active disease.   Electronically Signed   By: Irish Lack M.D.   On: 03/29/2014 22:29    Review of Systems  All other systems reviewed and are negative.  Blood pressure 135/80, pulse 104, temperature 96.8 F (36 C), temperature source Temporal, resp. rate 14, SpO2 100 %. Physical Exam  Constitutional: She appears well-developed and well-nourished.  HENT:  Head: Normocephalic and atraumatic.  Cardiovascular: Normal rate.   Respiratory: Effort normal.  Musculoskeletal:       Left upper arm: She exhibits laceration.       Left forearm: She exhibits laceration.  Complex left upper extremity lacerations s/p dog attack  Skin: Skin is warm.  Psychiatric: She has a normal mood and affect. Her behavior is normal. Judgment and thought content normal.    Assessment/Plan: As above  Plan explore and repair as needed  Allison [name] A 03/29/2014, 10:36 PM

## 2014-04-01 NOTE — Anesthesia Procedure Notes (Signed)
Procedure Name: LMA Insertion Date/Time: 04/01/2014 7:36 AM Performed by: Coralee RudFLORES, Ivalene Platte Pre-anesthesia Checklist: Patient identified, Emergency Drugs available, Suction available and Patient being monitored Patient Re-evaluated:Patient Re-evaluated prior to inductionOxygen Delivery Method: Circle system utilized Preoxygenation: Pre-oxygenation with 100% oxygen Intubation Type: IV induction LMA: LMA inserted LMA Size: 4.0 Placement Confirmation: positive ETCO2 Tube secured with: Tape Dental Injury: Teeth and Oropharynx as per pre-operative assessment

## 2014-04-01 NOTE — Interval H&P Note (Signed)
History and Physical Interval Note:  04/01/2014 6:25 AM  Allison [name]  has presented today for surgery, with the diagnosis of dog bite  The various methods of treatment have been discussed with the patient and family. After consideration of risks, benefits and other options for treatment, the patient has consented to  Procedure(s): DRESSING CHANGE LEFT UPPER EXTERMITY AND RIGHT HAND UNDER ANESTHESIA (N/A) as a surgical intervention .  The patient's history has been reviewed, patient examined, no change in status, stable for surgery.  I have reviewed the patient's chart and labs.  Questions were answered to the patient's satisfaction.     Dairl PonderWEINGOLD,Oley Lahaie A

## 2014-04-01 NOTE — Op Note (Signed)
NAMDonnella Sham:  HEMRICK, Perry             ACCOUNT NO.:  [id number]  MEDICAL RECORD NO.:  [id number]  LOCATION:  [facility]                         FACILITY:  [facility]  PHYSICIAN:  Artist PaisMatthew A. Latrisa Hellums, M.D.DATE OF BIRTH:  [birth year]  DATE OF PROCEDURE:  03/31/2014 DATE OF DISCHARGE:                              OPERATIVE REPORT   PREOPERATIVE DIAGNOSIS:  Complex dog bite, left upper extremity.  POSTOPERATIVE DIAGNOSIS:  Complex dog bite, left upper extremity.  PROCEDURE:  Dressing change under anesthesia, left upper extremity.  SURGEON:  Artist PaisMatthew A. Mina MarbleWeingold, M.D.  ASSISTANT:  None.  TOURNIQUET:  None.  COMPLICATIONS:  None.  DRAINS:  None.  PROCEDURE IN DETAIL:  The patient was taken to the operative suite. After induction of adequate general anesthetic, laryngeal mask airway, left upper extremity was approached with for dressing change.  We took the dressings down from the upper extremity, inspected all the wounds, cultured the serous drainage for aerobic, anaerobic, and stat Gram stain.  Wounds all looked good.  No signs of gross infection.  Good granulation tissue.  They were redressed lightly with Xeroform, 4 x 4s, and Kerlix and Ace wrap.  The patient tolerated the procedure well, went to the recovery room in stable fashion.     Artist PaisMatthew A. Mina MarbleWeingold, M.D.     MAW/MEDQ  D:  04/01/2014  T:  04/01/2014  Job:  [id number]

## 2014-04-02 LAB — TYPE AND SCREEN
ABO/RH(D): O POS
ANTIBODY SCREEN: NEGATIVE
UNIT DIVISION: 0
UNIT DIVISION: 0
UNIT DIVISION: 0
UNIT DIVISION: 0
Unit division: 0
Unit division: 0

## 2014-04-02 MED ORDER — PROMETHAZINE HCL 25 MG/ML IJ SOLN
12.5000 mg | Freq: Four times a day (QID) | INTRAMUSCULAR | Status: DC | PRN
  Administered 2014-04-02 – 2014-04-03 (×3): 12.5 mg via INTRAVENOUS
  Filled 2014-04-02 (×3): qty 1

## 2014-04-02 MED ORDER — ENOXAPARIN SODIUM 40 MG/0.4ML ~~LOC~~ SOLN
40.0000 mg | SUBCUTANEOUS | Status: DC
  Administered 2014-04-02 – 2014-04-03 (×2): 40 mg via SUBCUTANEOUS
  Filled 2014-04-02 (×2): qty 0.4

## 2014-04-02 MED ORDER — NICOTINE 21 MG/24HR TD PT24
21.0000 mg | MEDICATED_PATCH | Freq: Every day | TRANSDERMAL | Status: DC
  Administered 2014-04-02 – 2014-04-03 (×2): 21 mg via TRANSDERMAL
  Filled 2014-04-02 (×2): qty 1

## 2014-04-02 NOTE — Progress Notes (Signed)
    Subjective  -  Complaining of pain in her arm   Physical Exam:   Digital Doppler signals Some drainage through the dressing overnight       Assessment/Plan:    Excellent arterial blood flow to the digital arteries of the left hand, status post ligation of radial artery.  Stable from a vascular perspective.  Allison [name] IV, V. WELLS 04/02/2014 8:47 AM --  Filed Vitals:   04/02/14 0521  BP: 123/79  Pulse: 96  Temp: 98.5 F (36.9 C)  Resp: 18    Intake/Output Summary (Last 24 hours) at 04/02/14 0847 Last data filed at 04/02/14 0523  Gross per 24 hour  Intake    445 ml  Output   3325 ml  Net  -2880 ml     Laboratory CBC    Component Value Date/Time   WBC 16.3* 04/01/2014 0253   HGB 9.2* 04/01/2014 0253   HCT 27.3* 04/01/2014 0253   PLT 183 04/01/2014 0253    BMET    Component Value Date/Time   NA 138 03/30/2014 0500   K 3.9 03/30/2014 0500   CL 110 03/30/2014 0500   CO2 20 03/30/2014 0500   GLUCOSE 127* 03/30/2014 0500   BUN 5* 03/30/2014 0500   CREATININE 0.77 03/30/2014 0500   CALCIUM 7.3* 03/30/2014 0500   GFRNONAA >90 03/30/2014 0500   GFRAA >90 03/30/2014 0500    COAG Lab Results  Component Value Date   INR 1.31 03/30/2014   INR 1.23 03/29/2014   No results found for: PTT  Antibiotics Anti-infectives    Start     Dose/Rate Route Frequency Ordered Stop   04/01/14 1200  clindamycin (CLEOCIN) capsule 150 mg     150 mg Oral 4 times per day 04/01/14 1100     04/01/14 0000  ciprofloxacin (CIPRO) IVPB 400 mg     400 mg 200 mL/hr over 60 Minutes Intravenous Every 12 hours 03/31/14 2323     03/30/14 0400  Ampicillin-Sulbactam (UNASYN) 3 g in sodium chloride 0.9 % 100 mL IVPB  Status:  Discontinued     3 g 100 mL/hr over 60 Minutes Intravenous Every 6 hours 03/30/14 0256 03/31/14 2323   03/29/14 2200  ceFAZolin (ANCEF) IVPB 2 g/50 mL premix     2 g 100 mL/hr over 30 Minutes Intravenous  Once 03/29/14 2157 03/29/14 2228       V.  Charlena CrossWells Rynell Ciotti IV, M.D. [facility] Office: [phone] Pager:  [phone]

## 2014-04-02 NOTE — Progress Notes (Signed)
1 Day Post-Op  Subjective: HD#5 Stable and alert. Did not sleep well. Some understandable anxiety about events. Has some pain left upper extremity. Right hand and left leg are not painful. Underwent dressing change left upper extremity yesterday and wounds reportedly looked good. Gets up to chair with assistance.  Objective: Vital signs in last 24 hours: Temp:  [98.5 F (36.9 C)-99 F (37.2 C)] 98.5 F (36.9 C) (03/06 0521) Pulse Rate:  [92-110] 96 (03/06 0521) Resp:  [16-18] 18 (03/06 0521) BP: (121-158)/(61-79) 123/79 mmHg (03/06 0521) SpO2:  [97 %-99 %] 98 % (03/06 0521) Weight:  [132 lb 6.4 oz (60.056 kg)] 132 lb 6.4 oz (60.056 kg) (03/05 1420) Last BM Date: 03/29/14  Intake/Output from previous day: 03/05 0701 - 03/06 0700 In: 1295 [P.O.:445; I.V.:850] Out: 3375 [Urine:3375] Intake/Output this shift:    General appearance: Alert. Cooperative. Metal status normal. Mild anxiety, understandable. Extremities: Neurovascular intact all 4 extremities.. Left leg dressings, above-knee and below-knee change. All incisions look good without skin necrosis drainage or infection. Redressed with dry 4 x 4. Right hand fingertip dressings look good.  Lab Results:  No results found for this or any previous visit (from the past 24 hour(s)).   Studies/Results: No results found.  . ciprofloxacin  400 mg Intravenous Q12H  . clindamycin  150 mg Oral 4 times per day  . docusate sodium  100 mg Oral BID  . levothyroxine  150 mcg Oral QAC breakfast  . neomycin-bacitracin-polymyxin   Topical QODAY  . pantoprazole  40 mg Oral Daily   Or  . pantoprazole (PROTONIX) IV  40 mg Intravenous Daily     Assessment/Plan: s/p Procedure(s): DRESSING CHANGE LEFT UPPER EXTERMITY AND LEFT HAND UNDER ANESTHESIA  Multiple dog bites S/P repair mult lacs R hand, LUE, LLE - per Dr. Mina MarbleWeingold. Dressing change to LLE QOD with triple abx ointment Changed LLE today Dc foley Wound care orders to right hand and  left leg written. L radial artery injury - S/P ligation by Dr. Hart [name], hand perfused, follow dopplers ABL anemia - improved S/P TF ID - Cipro empiric FEN - CLD, NPO at [facility] for OR PT/OT-reconsult for ROM and gait training VTE - PAS/Lovenox Dispo - to SDU   @PROBHOSP @  LOS: 3 days    Deiondra Denley M 04/02/2014  . .prob

## 2014-04-02 NOTE — Progress Notes (Signed)
Patient more comfortable today  Please start BID wet to dry dressing changes to open areas on left arm/forearm  Healing areas can be dry dressing changes   OT to see for volar wrist splint in slight extension  Will see in my office tuesday

## 2014-04-03 ENCOUNTER — Encounter (HOSPITAL_COMMUNITY): Payer: Self-pay | Admitting: Orthopedic Surgery

## 2014-04-03 DIAGNOSIS — E079 Disorder of thyroid, unspecified: Secondary | ICD-10-CM | POA: Insufficient documentation

## 2014-04-03 DIAGNOSIS — S55109A Unspecified injury of radial artery at forearm level, unspecified arm, initial encounter: Secondary | ICD-10-CM | POA: Diagnosis present

## 2014-04-03 DIAGNOSIS — D62 Acute posthemorrhagic anemia: Secondary | ICD-10-CM | POA: Diagnosis not present

## 2014-04-03 LAB — CBC
HCT: 24.8 % — ABNORMAL LOW (ref 36.0–46.0)
Hemoglobin: 8.2 g/dL — ABNORMAL LOW (ref 12.0–15.0)
MCH: 30.1 pg (ref 26.0–34.0)
MCHC: 33.1 g/dL (ref 30.0–36.0)
MCV: 91.2 fL (ref 78.0–100.0)
Platelets: 304 10*3/uL (ref 150–400)
RBC: 2.72 MIL/uL — ABNORMAL LOW (ref 3.87–5.11)
RDW: 14.1 % (ref 11.5–15.5)
WBC: 11.5 10*3/uL — ABNORMAL HIGH (ref 4.0–10.5)

## 2014-04-03 LAB — WOUND CULTURE
CULTURE: NO GROWTH
GRAM STAIN: NONE SEEN

## 2014-04-03 MED ORDER — HYDROCODONE-ACETAMINOPHEN 10-325 MG PO TABS
0.5000 | ORAL_TABLET | ORAL | Status: DC | PRN
  Administered 2014-04-03 (×2): 2 via ORAL
  Filled 2014-04-03 (×2): qty 2

## 2014-04-03 MED ORDER — AMOXICILLIN-POT CLAVULANATE 875-125 MG PO TABS: 1.0000 | ORAL_TABLET | Freq: Two times a day (BID) | ORAL | Status: DC

## 2014-04-03 MED ORDER — AMOXICILLIN-POT CLAVULANATE 875-125 MG PO TABS
1.0000 | ORAL_TABLET | Freq: Two times a day (BID) | ORAL | Status: DC
  Administered 2014-04-03: 1 via ORAL
  Filled 2014-04-03: qty 1

## 2014-04-03 MED ORDER — POLYETHYLENE GLYCOL 3350 17 G PO PACK
17.0000 g | PACK | Freq: Every day | ORAL | Status: DC
  Filled 2014-04-03: qty 1

## 2014-04-03 MED ORDER — HYDROCODONE-ACETAMINOPHEN 10-325 MG PO TABS: 0.5000 | ORAL_TABLET | ORAL | Status: DC | PRN

## 2014-04-03 MED ORDER — MORPHINE SULFATE 2 MG/ML IJ SOLN
2.0000 mg | INTRAMUSCULAR | Status: DC | PRN
  Administered 2014-04-03 (×2): 2 mg via INTRAVENOUS
  Filled 2014-04-03 (×2): qty 1

## 2014-04-03 NOTE — Discharge Instructions (Signed)
Wash wounds daily in shower with soap and water. Do not soak. Apply antibiotic ointment (e.g. Neosporin) twice daily and as needed to keep moist to leg and closed arm wounds. Apply gauze moistened with saline to open arm wounds. Cover with dry dressing.  No driving while taking hydrocodone.

## 2014-04-03 NOTE — Progress Notes (Signed)
Physical Therapy Treatment Patient Details Name: Allison [name] MRN: [id number] DOB: 09/13/1970 Today's Date: 04/03/2014    History of Present Illness Pt is a 44 y.o. female admitted after dog bite at home with multiple lacerations to LUE, LLE, R hand, s/p Exploration left brachial radial and ulnar artery with ligation radial artery at origin and distally, thrombectomy left radial artery    PT Comments    Patient feeling better today and able to ambulate with MinA. Patient stated that she had been getting up with assistance from her husband. Patient stated that she is taking her time and ensuring her balance/dizziness prior to ambulation. Patient is planning to DC home later today. Patient with new L UE splint on causing some pain from dressing change. Patient asking questions regarding DC and outpatient follow up. Answered all questions as able and deferred to MD for any further questions.   Follow Up Recommendations  Outpatient PT     Equipment Recommendations  None recommended by PT    Recommendations for Other Services       Precautions / Restrictions Precautions Precautions: Other (comment) Precaution Comments: Patient now has splint on L UE Restrictions Weight Bearing Restrictions: No    Mobility  Bed Mobility Overal bed mobility: Modified Independent Bed Mobility: Supine to Sit;Sit to Supine Rolling: Modified independent (Device/Increase time) Sidelying to sit: Modified independent (Device/Increase time)          Transfers Overall transfer level: Needs assistance Equipment used: 1 person hand held assist Transfers: Sit to/from Stand Sit to Stand: Min guard Stand pivot transfers: Min guard       General transfer comment: Minguard for safety and to ensure that patient is stabilized   Ambulation/Gait Ambulation/Gait assistance: Min assist Ambulation Distance (Feet): 50 Feet Assistive device: 1 person hand held assist Gait Pattern/deviations: Step-to  pattern;Decreased step length - left;Decreased stance time - left;Decreased step length - right         Stairs            Wheelchair Mobility    Modified Rankin (Stroke Patients Only)       Balance             Standing balance-Leahy Scale: Good                      Cognition Arousal/Alertness: Awake/alert Behavior During Therapy: WFL for tasks assessed/performed Overall Cognitive Status: Within Functional Limits for tasks assessed                      Exercises Other Exercises Other Exercises: elevation and edema control Other Exercises: AAROM L shoulder FF/ER/Abd Other Exercises: elbow AAROM within pain tolerance Other Exercises: L digit AA/PROM conposite flexion/extension Other Exercises: intrinsic plus/minus position/tendon gliding    General Comments        Pertinent Vitals/Pain Pain Assessment: 0-10 Pain Score: 5  Pain Location: LUE and leg Pain Descriptors / Indicators: Burning;Aching;Sore Pain Intervention(s): Monitored during session;Repositioned;Patient requesting pain meds-RN notified    Home Living Family/patient expects to be discharged to:: Private residence Living Arrangements: Spouse/significant other Available Help at Discharge: Family;Available 24 hours/day Type of Home: House Home Access: Stairs to enter   Home Layout: Two level;Full bath on main level;Able to live on main level with bedroom/bathroom Home Equipment: None      Prior Function Level of Independence: Independent          PT Goals (current goals can now be found in the care plan  section) Acute Rehab PT Goals Patient Stated Goal: return to work and family Progress towards PT goals: Progressing toward goals    Frequency  Min 4X/week    PT Plan Current plan remains appropriate    Co-evaluation             End of Session   Activity Tolerance: Patient tolerated treatment well Patient left: in bed;with call bell/phone within reach      Time: 1411-1430 PT Time Calculation (min) (ACUTE ONLY): 19 min  Charges:  $Gait Training: 8-22 mins                    G Codes:      Fredrich BirksRobinette, Julia Elizabeth 04/03/2014, 2:38 PM 04/03/2014 Fredrich Birksobinette, Julia Elizabeth PTA [phone] pager [phone] office

## 2014-04-03 NOTE — Progress Notes (Signed)
Occupational Therapy Re-Evaluation (splint) Patient Details Name: Allison [name] MRN: [id number] DOB: 01-29-1970 Today's Date: 04/03/2014    History of Present Illness Pt is a 44 y.o. female admitted after dog bite at home with multiple lacerations to LUE, LLE, R hand, s/p Exploration left brachial radial and ulnar artery with ligation radial artery at origin and distally, thrombectomy left radial artery   Clinical Impression   Pt seen to evaluate for splinting needs. Pt appears to demonstrate L wrist drop with c/o decreased sensation L 4th/5th digits. Pt scheduled to have dressings changed. Asked for pain meds. Will return to assist nursing with dressing changes and fabricate splint.    Follow Up Recommendations  Outpatient OT;Other (comment) (rehab LUE as directed by Dr. Mina MarbleWeingold)    Equipment Recommendations  None recommended by OT    Recommendations for Other Services       Precautions / Restrictions Precautions Precautions: Other (comment) (multiple wounds L UE) Restrictions Weight Bearing Restrictions: No      Mobility Bed Mobility   Bed Mobility: Supine to Sit;Sit to Supine Rolling: Modified independent (Device/Increase time) Sidelying to sit: Modified independent (Device/Increase time)          Transfers Overall transfer level: Needs assistance Equipment used: 1 person hand held assist Transfers: Sit to/from Stand Sit to Stand: Min guard Stand pivot transfers: Min guard            Balance             Standing balance-Leahy Scale: Good                              ADL           Upper Body Bathing: Moderate assistance       Upper Body Dressing : Maximal assistance                   Functional mobility during ADLs: Min guard (steady assist) General ADL Comments: Educated on importance of elevation and edema control. Began educating on compensatory techniques     Vision     Perception     Praxis       Pertinent Vitals/Pain Pain Assessment: 0-10 Pain Score: 5  Pain Location: LUE Pain Descriptors / Indicators: Aching;Burning Pain Intervention(s): Limited activity within patient's tolerance;Repositioned     Hand Dominance     Extremity/Trunk Assessment Upper Extremity Assessment Upper Extremity Assessment: LUE deficits/detail LUE Deficits / Details: Pt appears to guard L shoulder ROM. P/AAROM WFL. multiple open wounds throughout LUE. elbow flex/ext limited due to pain. ?radial n involvement. ? ulnar n inivolovement 0 hyperextension 4th/5th MP with flexion of IPs LUE: Unable to fully assess due to pain LUE Sensation: decreased light touch (4th 5th digicit volar surface) LUE Coordination: decreased fine motor;decreased gross motor (able to touch thumb to index finger)  LUE in xeroform, nonstick dressing and kerlex with 2 ace bandages; positioned in dependent position with increased edema throughout LUE   Lower Extremity Assessment Lower Extremity Assessment: LLE deficits/detail LLE Deficits / Details: limited primarily by pain   Cervical / Trunk Assessment Cervical / Trunk Assessment: Normal   Communication Communication Communication: No difficulties   Cognition Arousal/Alertness: Awake/alert Behavior During Therapy: WFL for tasks assessed/performed Overall Cognitive Status: Within Functional Limits for tasks assessed                     General Comments   Pt discussing accident  and concern over returning to her home where the accident occurred    Exercises   Other Exercises Other Exercises: elevation and edema control Other Exercises: gentle A/AAROM L fingers/thumb within pain tolerance   Shoulder Instructions      Home Living Family/patient expects to be discharged to:: Private residence Living Arrangements: Spouse/significant other Available Help at Discharge: Family;Available 24 hours/day Type of Home: House Home Access: Stairs to enter [employer] of Steps: 2   Home Layout: Two level;Full bath on main level;Able to live on main level with bedroom/bathroom               Home Equipment: None          Prior Functioning/Environment Level of Independence: Independent             OT Diagnosis: Generalized weakness;Acute pain   OT Problem List: Decreased strength;Decreased range of motion;Decreased coordination;Impaired sensation;Impaired UE functional use;Pain;Increased edema   OT Treatment/Interventions: Self-care/ADL training;Therapeutic exercise;Therapeutic activities;Patient/family education;Splinting    OT Goals(Current goals can be found in the care plan section) Acute Rehab OT Goals Patient Stated Goal: return to work and family OT Goal Formulation: With patient Time For Goal Achievement: 04/13/14 Potential to Achieve Goals: Good  OT Frequency: Min 3X/week   Barriers to D/C:            Co-evaluation              End of Session Nurse Communication: Precautions;Other (comment);Patient requests pain meds  Activity Tolerance: Patient tolerated treatment well Patient left: in bed;with call bell/phone within reach;with family/visitor present   Time: 1610-9604 OT Time Calculation (min): 18 min Charges:  OT General Charges $OT Visit: 1 Procedure OT Evaluation $OT Re-eval: 1 Procedure G-Codes:    Domenic Schoenberger,HILLARY 20-Apr-2014, 2:05 PM   [facility], OTR/L  [phone] 04/20/14

## 2014-04-03 NOTE — Progress Notes (Signed)
Pt to d/c home today and [facility].  She will be staying with her parents and that address is: [address], [place], [place] [address].  Best phone number to call is: [phone].  Secondary number is [phone] (alternate number--don't call this one first).  Pt chose [facility] for agency.  Carlyle LipaMichelle Aleysia Oltmann, RN BSN MHA CCM  Case Manager, Trauma Service/Unit 44M [phone]

## 2014-04-03 NOTE — Discharge Summary (Signed)
Physician Discharge Summary  Patient ID: Allison [name] MRN: [id number] DOB/AGE: Jun 02, 1970 44 y.o.  Admit date: 03/29/2014 Discharge date: 04/03/2014  Discharge Diagnoses Patient Active Problem List   Diagnosis Date Noted  . Acute blood loss anemia 04/03/2014  . Thyroid disease 04/03/2014  . Radial artery injury 04/03/2014  . Dog bite of upper extremity 03/30/2014  . Dog bite of lower leg 03/30/2014    Consultants Dr. Dairl PonderMatthew [name] for hand surgery  Dr. Jerilee FieldJ. D. [name] for vascular surgery   Procedures 3/2 -- Exploration left brachial radial and ulnar artery with ligation radial artery at origin and distally and  thrombectomy left radial artery by Dr. Hart [name]  3/2 -- Left upper extremity exploration with incision and drainage and debridement of multiple wounds with loose primary closure as well as exploration of brachial artery, tying of the radial artery and at the antecubital fossa, as well as exploration of radial nerve at the wrist, median nerve at the wrist, ulnar artery at the wrist, and ulnar nerve at the wrist by Dr. Mina [name]  3/2 -- Closure of 20 lacerations of left lower extremity, 15 complex, by Dr. Jimmye NormanJames [name]   HPI: Allison [name] was the victim of [name] dog bite from [name] family dog. She came in as [name] Level 1 trauma. She had extensive lacerations and soft tissue wounds to the LUE and LLE with copious blood loss at scene per EMS. She was found to have extensive soft tissue loss and maceration to the LUE with exposed tendons, nerves, and vasculature. She had [name] dopplerable but unpalpable radial pulse. She also had more than 20 lacerations to the LLE without pulsatile bleeding. +2 DP and PT pulses. Hand and vascular surgery were consulted and she was taken to the OR for fixation.   Hospital Course: Following surgery she was transferred to the ICU for further care. She received [name] transfusion for her acute blood loss anemia and her hemoglobin remained stable following that. She was mobilized  with physical and occupational therapies and did well. Her pain was controlled on oral medications. Her wounds were followed closely without any signs of infection. She was treated with empiric antibiotics. She was discharged home in improved condition.     Medication List    TAKE these medications        amoxicillin-clavulanate 875-125 MG per tablet  Commonly known as:  AUGMENTIN  Take 1 tablet by mouth every 12 (twelve) hours.     HYDROcodone-acetaminophen 10-325 MG per tablet  Commonly known as:  NORCO  Take 0.5-2 tablets by mouth every 4 (four) hours as needed (Pain).     levothyroxine 150 MCG tablet  Commonly known as:  SYNTHROID, LEVOTHROID  Take 150 mcg by mouth daily before breakfast.     OVER THE COUNTER MEDICATION  Take 1 tablet by mouth daily. EHC-multtivitamin            Follow-up Information    Follow up with [facility] On 04/12/2014.   Why:  2:00PM   Contact information:   [address] [place] [id number] [phone]      Follow up with Marlowe ShoresWEINGOLD,MATTHEW A, MD.   Specialty:  Orthopedic Surgery   Contact information:   [address] [place] [address] [phone]       Follow up with Allison GipLAWSON, JAMES, MD.   Specialty:  Vascular Surgery   Contact information:   [address] [place] [address] [phone]        Signed:  Freeman Caldron, PA-C Pager: [phone] General Trauma PA Pager: [phone] 04/03/2014, 4:03 PM

## 2014-04-03 NOTE — Progress Notes (Signed)
Discussed discharge summary with patient. Reviewed all medications with patient. Patient received Rx. Patient ready for discharge. 

## 2014-04-03 NOTE — Progress Notes (Signed)
Patient ID: Allison [name], female   DOB: [birth year], 44 y.o.   MRN: [id number]   LOS: 4 days   Subjective: Rested better last night.   Objective: Vital signs in last 24 hours: Temp:  [97.5 F (36.4 C)-99 F (37.2 C)] 98.4 F (36.9 C) (03/07 0534) Pulse Rate:  [87-102] 89 (03/07 0534) Resp:  [16-18] 16 (03/07 0534) BP: (120-154)/(68-76) 154/74 mmHg (03/07 0534) SpO2:  [96 %-99 %] 98 % (03/07 0534) Last BM Date: 03/29/14   Laboratory  CBC Pending   Physical Exam General appearance: alert and no distress Resp: clear to auscultation bilaterally Cardio: regular rate and rhythm GI: normal findings: bowel sounds normal and soft, non-tender Incision/Wound:LLE wounds C/D/I   Assessment/Plan: Multiple dog bites S/P repair mult lacs R hand, LUE, LLE - per Dr. Mina MarbleWeingold L radial artery injury - S/P ligation by Dr. Hart [name], hand perfused, follow dopplers ABL anemia - improved S/P TF ID - On Cipro and Cleocin empiric, will simplify to Augmentin FEN - Increase Norco range VTE - SCD's, Lovenox Dispo - Home later today if WBC ok, tolerates Augmentin    Freeman CaldronMichael J. Kiley Solimine, PA-C Pager: [phone] General Trauma PA Pager: [phone]  04/03/2014

## 2014-04-03 NOTE — Progress Notes (Signed)
Occupational Therapy Treatment Patient Details Name: Allison [name] MRN: [id number] DOB: 1970/03/05 Today's Date: 04/03/2014    History of present illness Pt is a 44 y.o. female admitted after dog bite at home with multiple lacerations to LUE, LLE, R hand, s/p Exploration left brachial radial and ulnar artery with ligation radial artery at origin and distally, thrombectomy left radial artery   OT comments  Volar based wrist cock up splint fabricated after assisting nursing with dressing change. Educated pt/family in compensatory techniques for ADL and edema control techniques; care, positioning and use of splint. Will return this pm prior to D/C to review.   Follow Up Recommendations  Outpatient OT;Other (comment) (as directed by Dr. Mina MarbleWeingold)    Equipment Recommendations  None recommended by OT    Recommendations for Other Services      Precautions / Restrictions Precautions Precautions: Other (comment) Restrictions Weight Bearing Restrictions: No       Mobility Bed Mobility   Bed Mobility: Supine to Sit;Sit to Supine Rolling: Modified independent (Device/Increase time) Sidelying to sit: Modified independent (Device/Increase time)          Transfers Overall transfer level: Needs assistance Equipment used: 1 person hand held assist Transfers: Sit to/from Stand Sit to Stand: Min guard Stand pivot transfers: Min guard            Balance             Standing balance-Leahy Scale: Good                     ADL           Upper Body Bathing: Moderate assistance       Upper Body Dressing : Maximal assistance                   Functional mobility during ADLs:  (assisted pt to bathroom. steady assist) General ADL Comments: Educated pt/family on compensatory techniques for ADL. Emphasized importance of positioning for edema control Pt/husband demosntrated understanding.                                      Cognition    Behavior During Therapy: WFL for tasks assessed/performed Overall Cognitive Status: Within Functional Limits for tasks assessed                       Extremity/Trunk Assessment  Upper Extremity Assessment Upper Extremity Assessment: LUE deficits/detail LUE Deficits / Details: Pt appears to guard L shoulder ROM. P/AAROM WFL. multiple open wounds throughout LUE. elbow flex/ext limited due to pain. ?radial n involvement. ? ulnar n inivolovement 0 hyperextension 4th/5th MP with flexion of IPs LUE: Unable to fully assess due to pain LUE Sensation: decreased light touch (4th 5th digicit volar surface) LUE Coordination: decreased fine motor;decreased gross motor (able to touch thumb to index finger)   Lower Extremity Assessment Lower Extremity Assessment: LLE deficits/detail LLE Deficits / Details: limited primarily by pain   Cervical / Trunk Assessment Cervical / Trunk Assessment: Normal    Exercises Other Exercises Other Exercises: elevation and edema control Other Exercises: AAROM L shoulder FF/ER/Abd Other Exercises: elbow AAROM within pain tolerance Other Exercises: L digit AA/PROM conposite flexion/extension Other Exercises: intrinsic plus/minus position/tendon gliding   UE Instructions  Assisted nursing with dressing change. Xeroform to wounds, nonstick pads, kerlex (2); acewrap (2) - extended period of time required  General Comments      Pertinent Vitals/ Pain       Pain Assessment: 0-10 Pain Score: 5  Pain Location: LUE Pain Descriptors / Indicators: Aching;Burning Pain Intervention(s): Limited activity within patient's tolerance;Monitored during session;Repositioned  Home Living Family/patient expects to be discharged to:: Private residence Living Arrangements: Spouse/significant other Available Help at Discharge: Family;Available 24 hours/day Type of Home: House Home Access: Stairs to enter [employer] of Steps: 2   Home Layout: Two  level;Full bath on main level;Able to live on main level with bedroom/bathroom               Home Equipment: None          Prior Functioning/Environment Level of Independence: Independent            Frequency Min 3X/week     Progress Toward Goals  OT Goals(current goals can now be found in the care plan section)  Progress towards OT goals: Progressing toward goals  Acute Rehab OT Goals Patient Stated Goal: return to work and family OT Goal Formulation: With patient Time For Goal Achievement: 04/13/14 Potential to Achieve Goals: Good ADL Goals Pt Will Perform Eating: with set-up;sitting Pt Will Perform Grooming: with set-up;sitting Pt Will Perform Upper Body Bathing: sitting;with min assist Pt Will Perform Upper Body Dressing: with min assist;sitting Pt Will Transfer to Toilet: with supervision;ambulating Pt Will Perform Toileting - Clothing Manipulation and hygiene: with supervision;sit to/from stand Pt/caregiver will Perform Home Exercise Program: Increased ROM;Left upper extremity;Independently;With written HEP provided Additional ADL Goal #2: Pt will verbalize understanding of care, contraindications  and functional purpose of L wrist cock up splint  Plan Discharge plan remains appropriate                     End of Session     Activity Tolerance Patient tolerated treatment well   Patient Left in bed;with call bell/phone within reach;with family/visitor present   Nurse Communication Precautions        Time: 1020-1205 OT Time Calculation (min): 105 min  Charges: OT General Charges $OT Visit: 1 Procedure OT Treatments $Self Care/Home Management : 8-22 mins $Therapeutic Activity: 23-37 mins $Therapeutic Exercise: 8-22 mins $Orthotics Fit/Training: 38-52 mins $ OT Supplies:  (75.00)  Aalijah Mims,HILLARY 04/03/2014, 2:28 PM   [facility], OTR/L  [phone] 04/03/2014

## 2014-04-06 LAB — ANAEROBIC CULTURE: Gram Stain: NONE SEEN

## 2014-04-12 ENCOUNTER — Encounter: Payer: Self-pay | Admitting: Genetic Counselor

## 2014-05-02 ENCOUNTER — Telehealth: Payer: Self-pay | Admitting: Vascular Surgery

## 2014-05-02 ENCOUNTER — Other Ambulatory Visit: Payer: Self-pay | Admitting: *Deleted

## 2014-05-02 DIAGNOSIS — Z48812 Encounter for surgical aftercare following surgery on the circulatory system: Secondary | ICD-10-CM

## 2014-05-02 DIAGNOSIS — T148XXA Other injury of unspecified body region, initial encounter: Secondary | ICD-10-CM

## 2014-05-02 NOTE — Telephone Encounter (Signed)
Ms Hemrick left msg on appt VM that she needed to schedule her post op appointment. After looking in her chart, she needs a follow up with [facility]- the discharge summary did not specify time period. I will forward to RN for appropriate time period.  I have left a message for Johanna at [phone] to call back and ask for DANA to schedule.

## 2014-05-02 NOTE — Telephone Encounter (Signed)
Per Dr. Hart [name], he does need to see patient asap for follow up. He also said to do upper ext. Duplex.

## 2014-05-08 ENCOUNTER — Encounter: Payer: Self-pay | Admitting: Vascular Surgery

## 2014-05-09 ENCOUNTER — Encounter: Payer: Self-pay | Admitting: Vascular Surgery

## 2014-05-09 ENCOUNTER — Ambulatory Visit (HOSPITAL_COMMUNITY)
Admission: RE | Admit: 2014-05-09 | Discharge: 2014-05-09 | Disposition: A | Discharge status: Home / Self Care | Payer: 59 | Source: Ambulatory Visit | Attending: Vascular Surgery | Admitting: Vascular Surgery

## 2014-05-09 ENCOUNTER — Ambulatory Visit (INDEPENDENT_AMBULATORY_CARE_PROVIDER_SITE_OTHER): Payer: Self-pay | Admitting: Vascular Surgery

## 2014-05-09 ENCOUNTER — Other Ambulatory Visit: Payer: Self-pay | Admitting: Vascular Surgery

## 2014-05-09 VITALS — BP 132/94 | HR 97 | Resp 16 | Ht 64.0 in | Wt 128.0 lb

## 2014-05-09 DIAGNOSIS — T148XXA Other injury of unspecified body region, initial encounter: Secondary | ICD-10-CM

## 2014-05-09 DIAGNOSIS — W540XXD Bitten by dog, subsequent encounter: Secondary | ICD-10-CM | POA: Insufficient documentation

## 2014-05-09 DIAGNOSIS — S55112D Laceration of radial artery at forearm level, left arm, subsequent encounter: Secondary | ICD-10-CM | POA: Insufficient documentation

## 2014-05-09 DIAGNOSIS — Z48812 Encounter for surgical aftercare following surgery on the circulatory system: Secondary | ICD-10-CM

## 2014-05-09 DIAGNOSIS — Z9889 Other specified postprocedural states: Secondary | ICD-10-CM | POA: Diagnosis not present

## 2014-05-09 DIAGNOSIS — F17200 Nicotine dependence, unspecified, uncomplicated: Secondary | ICD-10-CM | POA: Insufficient documentation

## 2014-05-09 DIAGNOSIS — S55102D Unspecified injury of radial artery at forearm level, left arm, subsequent encounter: Secondary | ICD-10-CM

## 2014-05-09 DIAGNOSIS — T148 Other injury of unspecified body region: Secondary | ICD-10-CM

## 2014-05-09 NOTE — Progress Notes (Signed)
Subjective:     Patient ID: Allison [name], female   DOB: Jun 27, 1970, 44 y.o.   MRN: [id number]  HPI this 44 year old female is seen in follow-up regarding a traumatic injury-dog bite-to the left upper extremity with extensive soft tissue injury, nerve injury, and total disruption of left radial artery. Upon exploration the patient had good flow intact in the ulnar artery with the radial artery totally evulsed and it was ligated proximally and distally. Patient has had satisfactory arterial flow. She is currently going to rehabilitation frequently and continues to have 2 wounds which have not completely healed in the left forearm and nerve dysfunction. She is followed by Dr. [name]   Review of Systems     Objective:   Physical Exam BP 132/94 mmHg  Pulse 97  Resp 16  Ht 5\' 4"  (1.626 m)  Wt 128 lb (58.06 kg)  BMI 21.96 kg/m2  Gen. alert and oriented 3 in no apparent distress Left upper extremity is in splint down to the wrist and hand. She is going to rehabilitation today were dressing will be changed. Fingers appear adequately perfused.  Today I ordered a limited Doppler study of the fingers and ulnar artery. There is triphasic flow in the ulnar artery at the wrist and good digital flow to all fingers with no evidence of arterial insufficiency     Assessment:     Satisfactory arterial supply to left upper extremity following extensive dog bite injury to left forearm with a bulge in of left radial artery and now patency of left ulnar artery    Plan:     Patient will return in 6 months for attempted duplex scan of her left forearm. Hopefully the open wounds will be healed by that time and we will be able to have satisfactory study performed

## 2014-05-09 NOTE — Addendum Note (Signed)
Addended by: Dannielle KarvonenMANESS-HARRISON, Tavien Chestnut C on: 05/09/2014 03:57 PM   Modules accepted: Orders

## 2014-10-11 ENCOUNTER — Other Ambulatory Visit: Payer: Self-pay | Admitting: Orthopedic Surgery

## 2014-10-30 ENCOUNTER — Other Ambulatory Visit: Payer: Self-pay | Admitting: Orthopedic Surgery

## 2014-10-31 ENCOUNTER — Encounter: Payer: Self-pay | Admitting: Family

## 2014-11-01 ENCOUNTER — Ambulatory Visit (HOSPITAL_COMMUNITY)
Admission: RE | Admit: 2014-11-01 | Discharge: 2014-11-01 | Disposition: A | Discharge status: Home / Self Care | Payer: 59 | Source: Ambulatory Visit | Attending: Family | Admitting: Family

## 2014-11-01 ENCOUNTER — Ambulatory Visit (INDEPENDENT_AMBULATORY_CARE_PROVIDER_SITE_OTHER): Payer: 59 | Admitting: Family

## 2014-11-01 ENCOUNTER — Encounter: Payer: Self-pay | Admitting: Family

## 2014-11-01 VITALS — BP 119/81 | HR 71 | Temp 99.3°F | Resp 16 | Ht 63.0 in | Wt 124.0 lb

## 2014-11-01 DIAGNOSIS — Z48812 Encounter for surgical aftercare following surgery on the circulatory system: Secondary | ICD-10-CM

## 2014-11-01 DIAGNOSIS — W540XXD Bitten by dog, subsequent encounter: Secondary | ICD-10-CM | POA: Insufficient documentation

## 2014-11-01 DIAGNOSIS — S55102D Unspecified injury of radial artery at forearm level, left arm, subsequent encounter: Secondary | ICD-10-CM | POA: Diagnosis not present

## 2014-11-01 DIAGNOSIS — Z720 Tobacco use: Secondary | ICD-10-CM

## 2014-11-01 DIAGNOSIS — F17200 Nicotine dependence, unspecified, uncomplicated: Secondary | ICD-10-CM

## 2014-11-01 NOTE — Addendum Note (Signed)
Addended by: Adria Dill L on: 11/01/2014 10:37 AM   Modules accepted: Orders

## 2014-11-01 NOTE — Progress Notes (Signed)
[facility] HISTORY AND PHYSICAL   History of Present Illness Allison [name] is a 44 y.o. female patient of Dr. Hart Rochester is seen in follow-up regarding a traumatic injury-dog bite on March 29, 2014, to the left upper extremity with extensive soft tissue injury, nerve injury, and total disruption of left radial artery. Upon exploration the patient had good flow intact in the ulnar artery with the radial artery totally evulsed and it was ligated proximally and distally. Patient has had satisfactory arterial flow. She is currently going to rehabilitation frequently and continues to have 2 wounds which have not completely healed in the left forearm and nerve dysfunction. She is followed by Dr. Dairl Ponder, hand surgeon. She is scheduled to have surgery on her left hand later this month in order to enable pt to flex her left fingers better.  She also sustained dog bites to her left leg and has residual numbness at anterior thigh and most of her calf. Pt denies any history of stroke, denies any history of cardiac problems.   The patient denies New Medical or Surgical History.  Pt Diabetic: No Pt smoker: smoker  (1/2 ppd, started smoking at about age 59 yrs), she quit for 3 months after the left arm surgery.  Pt meds include: Statin :No, pt states her cholesterol has been OK.  Betablocker: No ASA: Yes, 81 mg daily Other anticoagulants/antiplatelets: no   Past Medical History  Diagnosis Date  . Thyroid disease     Social History Social History  Substance Use Topics  . Smoking status: Current Every Day Smoker  . Smokeless tobacco: Never Used  . Alcohol Use: No    Family History Family History  Problem Relation Age of Onset  . Breast cancer Mother 26  . Cancer Mother     Breast  . Heart attack Father     Past Surgical History  Procedure Laterality Date  . Complex wound closure Left 03/29/2014    Procedure: CLOSURE MULTIPLE LACERATIONS LEFT LEG AND  Left ARM, Embolectomy., LIGATION OF RADIAL ARTERY;  Surgeon: Frederik Schmidt, MD;  Location[facility] OR;  Service: General;  Laterality: Left;  . I&d extremity Left 03/29/2014    Procedure: IRRIGATION AND DEBRIDEMENT CLOSURE MULTIPLE LACERATIONS HAND AND ARM, lower legs;  Surgeon: Frederik Schmidt, MD;  Location: [facility] OR;  Service: General;  Laterality: Left;  . Dressing change under anesthesia Left 04/01/2014    Procedure: DRESSING CHANGE LEFT UPPER EXTERMITY AND LEFT HAND UNDER ANESTHESIA;  Surgeon: Dairl Ponder, MD;  Location: [facility] OR;  Service: Orthopedics;  Laterality: Left;    No Known Allergies  Current Outpatient Prescriptions  Medication Sig Dispense Refill  . levothyroxine (SYNTHROID, LEVOTHROID) 150 MCG tablet Take 150 mcg by mouth daily before breakfast.    . OVER THE COUNTER MEDICATION Take 1 tablet by mouth daily. EHT-multtivitamin    . amoxicillin-clavulanate (AUGMENTIN) 875-125 MG per tablet Take 1 tablet by mouth every 12 (twelve) hours. (Patient not taking: Reported on 05/09/2014) 15 tablet 0  . HYDROcodone-acetaminophen (NORCO) 10-325 MG per tablet Take 0.5-2 tablets by mouth every 4 (four) hours as needed (Pain). (Patient not taking: Reported on 11/01/2014) 60 tablet 0   No current facility-administered medications for this visit.    ROS: See HPI for pertinent positives and negatives.   Physical Examination  Filed Vitals:   11/01/14 0908  BP: 119/81  Pulse: 71  Temp: 99.3 F (37.4 C)  TempSrc: Oral  Resp: 16  Height: 5\' 3"  (1.6 m)  Weight: 124 lb (56.246 kg)  SpO2: 98%   Body mass index is 21.97 kg/(m^2).  General: A&O x 3, WDWN. Gait: normal Eyes: PERRLA. Pulmonary: CTAB, without wheezes , rales or rhonchi. Cardiac: regular Rythm, without detected murmur.         Carotid Bruits Right Left   Negative Negative  Aorta is slightly palpable. Radial pulses: right is 2+ palpable, left radial is not palpable, left ulnar pulse is not palpable but is triphasic by Doppler. Left  brachial pulse is 2+ palpable                         VASCULAR EXAM: Extremities without ischemic changes, without Gangrene; without open wounds. Fingers of both hands are warm with brisk capillary refill.                                                                                                          LE Pulses Right Left       FEMORAL  2+ palpable  2+ palpable        POPLITEAL  2+ palpable   2+ palpable       POSTERIOR TIBIAL  2+ palpable   1+ palpable        DORSALIS PEDIS      ANTERIOR TIBIAL 2+ palpable  1+ palpable    Abdomen: soft, NT, no palpable masses. Skin: no rashes, no ulcers. Musculoskeletal: traumatic and surgical repair muscle deformity in left upper extremity and left lower extremity.  Neurologic: A&O X 3; Appropriate Affect; MOTOR FUNCTION:  moving all extremities, motor strength 5/5 throughout except 3/5 in left upper extremity, unable to make a fist with left hand. Speech is fluent/normal. CN 2-12 intact.    Non-Invasive Vascular Imaging: DATE: 11/01/2014 UPPER EXTREMITY ARTERIAL DUPLEX EVALUATION    INDICATION: Evaluation of left upper extremity arterial system following dog bite.    PREVIOUS INTERVENTION(S): Exploration of left brachial, radial and ulnar arteries with ligation of the radial artery at origin and distally 03/29/2014.    DUPLEX EXAM:     RIGHT LOCATION LEFT   Peak Systolic Velocity (cm/s) Ratio (if abnormal) Waveform  Peak Systolic Velocity (cm/s) Ratio (if abnormal) Waveform     Subclavian Artery        Axillary Artery        Brachial Artery Proximal 71  T     Brachial Artery Mid 72  T     Brachial Artery Distal  44  T     Radial Artery Proximal Ligated       Radial Artery Distal Ligated       Ulnar Artery Proximal 43  T     Ulnar Artery Distal Mid 49 Distal 62 Wrist 67  T    Waveform:    M - Monophasic       B - Biphasic       T - Triphasic    ADDITIONAL FINDINGS:     IMPRESSION: Widely patent left brachial and ulnar  arteries with triphasic waveforms.  ASSESSMENT: Allison [name] is a 44 y.o. female who is s/p exploration of left brachial, radial and ulnar arteries with ligation of the radial artery at origin and distally on 03/29/2014 subsequent to severe dog bite injuries. Today's left upper extremity arterial duplex suggests widely patent left brachial and ulnar arteries with triphasic waveforms.  Pt has ongoing numbness and limited flexion of the fingers of her left hand due to nerve injury. She has another surgery scheduled this month to help enable her to better flex her left fingers.  She also has some nerve damage and numbness in her left leg from dog bite injuries from that same incident.   Fortunately she has no history of any cardiovascular events and she does not have DM. But unfortunately she continues to smoke. We discussed the vasospasm effect that nicotine has on the arterial system which limits the blood flow to her left upper extremity which is of most concern, and also limits blood flow to remainder of her body.   PLAN:  Based on the patient's vascular studies and examination, pt will return to clinic in 6 months months with left upper extremity arterial duplex. She knows to notify our office immediately with concerns re the circulation in her left upper extremity.  The patient was counseled re smoking cessation and given several free resources re smoking cessation.  I discussed in depth with the patient the nature of atherosclerosis, and emphasized the importance of maximal medical management including strict control of blood pressure, blood glucose, and lipid levels, obtaining regular exercise, and cessation of smoking.  The patient is aware that without maximal medical management the underlying atherosclerotic disease process will progress, limiting the benefit of any interventions.   Charisse March, RN, MSN, FNP-C [facility] Phone:  [phone]  Clinic MD: Edilia Bo  11/01/2014 9:28 AM

## 2014-11-01 NOTE — Patient Instructions (Signed)
Smoking Cessation, Tips for Success  If you are ready to quit smoking, congratulations! You have chosen to help yourself be healthier. Cigarettes bring nicotine, tar, carbon monoxide, and other irritants into your body. Your lungs, heart, and blood vessels will be able to work better without these poisons. There are many different ways to quit smoking. Nicotine gum, nicotine patches, a nicotine inhaler, or nicotine nasal spray can help with physical craving. Hypnosis, support groups, and medicines help break the habit of smoking.  WHAT THINGS CAN I DO TO MAKE QUITTING EASIER?   Here are some tips to help you quit for good:  · Pick a date when you will quit smoking completely. Tell all of your friends and family about your plan to quit on that date.  · Do not try to slowly cut down on the number of cigarettes you are smoking. Pick a quit date and quit smoking completely starting on that day.  · Throw away all cigarettes.    · Clean and remove all ashtrays from your home, work, and car.  · On a card, write down your reasons for quitting. Carry the card with you and read it when you get the urge to smoke.  · Cleanse your body of nicotine. Drink enough water and fluids to keep your urine clear or pale yellow. Do this after quitting to flush the nicotine from your body.  · Learn to predict your moods. Do not let a bad situation be your excuse to have a cigarette. Some situations in your life might tempt you into wanting a cigarette.  · Never have "just one" cigarette. It leads to wanting another and another. Remind yourself of your decision to quit.  · Change habits associated with smoking. If you smoked while driving or when feeling stressed, try other activities to replace smoking. Stand up when drinking your coffee. Brush your teeth after eating. Sit in a different chair when you read the paper. Avoid alcohol while trying to quit, and try to drink fewer caffeinated beverages. Alcohol and caffeine may urge you to  smoke.  · Avoid foods and drinks that can trigger a desire to smoke, such as sugary or spicy foods and alcohol.  · Ask people who smoke not to smoke around you.  · Have something planned to do right after eating or having a cup of coffee. For example, plan to take a walk or exercise.  · Try a relaxation exercise to calm you down and decrease your stress. Remember, you may be tense and nervous for the first 2 weeks after you quit, but this will pass.  · Find new activities to keep your hands busy. Play with a pen, coin, or rubber band. Doodle or draw things on paper.  · Brush your teeth right after eating. This will help cut down on the craving for the taste of tobacco after meals. You can also try mouthwash.    · Use oral substitutes in place of cigarettes. Try using lemon drops, carrots, cinnamon sticks, or chewing gum. Keep them handy so they are available when you have the urge to smoke.  · When you have the urge to smoke, try deep breathing.  · Designate your home as a nonsmoking area.  · If you are a heavy smoker, ask your health care provider about a prescription for nicotine chewing gum. It can ease your withdrawal from nicotine.  · Reward yourself. Set aside the cigarette money you save and buy yourself something nice.  · Look for   support from others. Join a support group or smoking cessation program. Ask someone at home or at work to help you with your plan to quit smoking.  · Always ask yourself, "Do I need this cigarette or is this just a reflex?" Tell yourself, "Today, I choose not to smoke," or "I do not want to smoke." You are reminding yourself of your decision to quit.  · Do not replace cigarette smoking with electronic cigarettes (commonly called e-cigarettes). The safety of e-cigarettes is unknown, and some may contain harmful chemicals.  · If you relapse, do not give up! Plan ahead and think about what you will do the next time you get the urge to smoke.  HOW WILL I FEEL WHEN I QUIT SMOKING?  You  may have symptoms of withdrawal because your body is used to nicotine (the addictive substance in cigarettes). You may crave cigarettes, be irritable, feel very hungry, cough often, get headaches, or have difficulty concentrating. The withdrawal symptoms are only temporary. They are strongest when you first quit but will go away within 10-14 days. When withdrawal symptoms occur, stay in control. Think about your reasons for quitting. Remind yourself that these are signs that your body is healing and getting used to being without cigarettes. Remember that withdrawal symptoms are easier to treat than the major diseases that smoking can cause.   Even after the withdrawal is over, expect periodic urges to smoke. However, these cravings are generally short lived and will go away whether you smoke or not. Do not smoke!  WHAT RESOURCES ARE AVAILABLE TO HELP ME QUIT SMOKING?  Your health care provider can direct you to community resources or hospitals for support, which may include:  · Group support.  · Education.  · Hypnosis.  · Therapy.     This information is not intended to replace advice given to you by your health care provider. Make sure you discuss any questions you have with your health care provider.     Document Released: 10/12/2003 Document Revised: 02/03/2014 Document Reviewed: 07/01/2012  Elsevier Interactive Patient Education ©2016 [employer]  You Can Quit Smoking  If you are ready to quit smoking or are thinking about it, congratulations! You have chosen to help yourself be healthier and live longer! There are lots of different ways to quit smoking. Nicotine gum, nicotine patches, a nicotine inhaler, or nicotine nasal spray can help with physical craving. Hypnosis, support groups, and medicines help break the habit of smoking.  TIPS TO GET OFF AND STAY OFF CIGARETTES  · Learn to predict your moods. Do not let a bad situation be your excuse to have a cigarette. Some situations in your life might tempt you  to have a cigarette.  · Ask friends and co-workers not to smoke around you.  · Make your home smoke-free.  · Never have "just one" cigarette. It leads to wanting another and another. Remind yourself of your decision to quit.  · On a card, make a list of your reasons for not smoking. Read it at least the same number of times a day as you have a cigarette. Tell yourself everyday, "I do not want to smoke. I choose not to smoke."  · Ask someone at home or work to help you with your plan to quit smoking.  · Have something planned after you eat or have a cup of coffee. Take a walk or get other exercise to perk you up. This will help to keep you from overeating.  ·   Try a relaxation exercise to calm you down and decrease your stress. Remember, you may be tense and nervous the first two weeks after you quit. This will pass.  · Find new activities to keep your hands busy. Play with a pen, coin, or rubber band. Doodle or draw things on paper.  · Brush your teeth right after eating. This will help cut down the craving for the taste of tobacco after meals. You can try mouthwash too.  · Try gum, breath mints, or diet candy to keep something in your mouth.  IF YOU SMOKE AND WANT TO QUIT:  · Do not stock up on cigarettes. Never buy a carton. Wait until one pack is finished before you buy another.  · Never carry cigarettes with you at work or at home.  · Keep cigarettes as far away from you as possible. Leave them with someone else.  · Never carry matches or a lighter with you.  · Ask yourself, "Do I need this cigarette or is this just a reflex?"  · Bet with someone that you can quit. Put cigarette money in a piggy bank every morning. If you smoke, you give up the money. If you do not smoke, by the end of the week, you keep the money.  · Keep trying. It takes 21 days to change a habit!  · Talk to your doctor about using medicines to help you quit. These include nicotine replacement gum, lozenges, or skin patches.     This  information is not intended to replace advice given to you by your health care provider. Make sure you discuss any questions you have with your health care provider.     Document Released: 11/09/2008 Document Revised: 04/07/2011 Document Reviewed: 11/09/2008  Elsevier Interactive Patient Education ©2016 Elsevier Inc[employer]

## 2014-11-06 ENCOUNTER — Encounter (HOSPITAL_BASED_OUTPATIENT_CLINIC_OR_DEPARTMENT_OTHER): Payer: Self-pay | Admitting: *Deleted

## 2014-11-13 ENCOUNTER — Ambulatory Visit (HOSPITAL_BASED_OUTPATIENT_CLINIC_OR_DEPARTMENT_OTHER)
Admission: RE | Admit: 2014-11-13 | Discharge: 2014-11-13 | Disposition: A | Discharge status: Home / Self Care | Payer: 59 | Source: Ambulatory Visit | Attending: Orthopedic Surgery | Admitting: Orthopedic Surgery

## 2014-11-13 ENCOUNTER — Ambulatory Visit (HOSPITAL_BASED_OUTPATIENT_CLINIC_OR_DEPARTMENT_OTHER): Payer: 59 | Admitting: Anesthesiology

## 2014-11-13 ENCOUNTER — Encounter (HOSPITAL_BASED_OUTPATIENT_CLINIC_OR_DEPARTMENT_OTHER): Payer: Self-pay | Admitting: Anesthesiology

## 2014-11-13 ENCOUNTER — Encounter (HOSPITAL_BASED_OUTPATIENT_CLINIC_OR_DEPARTMENT_OTHER)
Admission: RE | Disposition: A | Discharge status: Home / Self Care | Payer: Self-pay | Source: Ambulatory Visit | Attending: Orthopedic Surgery

## 2014-11-13 ENCOUNTER — Ambulatory Visit: Payer: Self-pay | Admitting: Orthopedic Surgery

## 2014-11-13 DIAGNOSIS — M24542 Contracture, left hand: Secondary | ICD-10-CM | POA: Insufficient documentation

## 2014-11-13 DIAGNOSIS — M24532 Contracture, left wrist: Secondary | ICD-10-CM | POA: Diagnosis present

## 2014-11-13 DIAGNOSIS — F17210 Nicotine dependence, cigarettes, uncomplicated: Secondary | ICD-10-CM | POA: Insufficient documentation

## 2014-11-13 HISTORY — DX: Hypothyroidism, unspecified: E03.9

## 2014-11-13 HISTORY — PX: DUPUYTREN CONTRACTURE RELEASE: SHX1478

## 2014-11-13 HISTORY — PX: CAPSULOTOMY: SHX379

## 2014-11-13 SURGERY — RELEASE, DUPUYTREN CONTRACTURE
Anesthesia: General | Site: Wrist | Laterality: Left

## 2014-11-13 MED ORDER — MIDAZOLAM HCL 2 MG/2ML IJ SOLN
1.0000 mg | INTRAMUSCULAR | Status: DC | PRN
  Administered 2014-11-13: 2 mg via INTRAVENOUS

## 2014-11-13 MED ORDER — LACTATED RINGERS IV SOLN
INTRAVENOUS | Status: DC
  Administered 2014-11-13: 07:00:00 via INTRAVENOUS

## 2014-11-13 MED ORDER — FENTANYL CITRATE (PF) 100 MCG/2ML IJ SOLN
INTRAMUSCULAR | Status: AC
  Filled 2014-11-13: qty 4

## 2014-11-13 MED ORDER — ONDANSETRON HCL 4 MG/2ML IJ SOLN: 4.0000 mg | Freq: Once | INTRAMUSCULAR | Status: DC | PRN

## 2014-11-13 MED ORDER — DEXAMETHASONE SODIUM PHOSPHATE 4 MG/ML IJ SOLN
INTRAMUSCULAR | Status: DC | PRN
  Administered 2014-11-13: 10 mg via INTRAVENOUS

## 2014-11-13 MED ORDER — 0.9 % SODIUM CHLORIDE (POUR BTL) OPTIME
TOPICAL | Status: DC | PRN
  Administered 2014-11-13: 300 mL

## 2014-11-13 MED ORDER — HYDROCODONE-ACETAMINOPHEN 5-325 MG PO TABS: 1.0000 | ORAL_TABLET | Freq: Four times a day (QID) | ORAL | Status: DC | PRN

## 2014-11-13 MED ORDER — FENTANYL CITRATE (PF) 100 MCG/2ML IJ SOLN
50.0000 ug | INTRAMUSCULAR | Status: DC | PRN
  Administered 2014-11-13: 100 ug via INTRAVENOUS

## 2014-11-13 MED ORDER — PROPOFOL 10 MG/ML IV BOLUS
INTRAVENOUS | Status: DC | PRN
  Administered 2014-11-13: 150 mg via INTRAVENOUS

## 2014-11-13 MED ORDER — GLYCOPYRROLATE 0.2 MG/ML IJ SOLN: 0.2000 mg | Freq: Once | INTRAMUSCULAR | Status: DC | PRN

## 2014-11-13 MED ORDER — SCOPOLAMINE 1 MG/3DAYS TD PT72: 1.0000 | MEDICATED_PATCH | Freq: Once | TRANSDERMAL | Status: DC | PRN

## 2014-11-13 MED ORDER — HYDROMORPHONE HCL 1 MG/ML IJ SOLN: 0.2500 mg | INTRAMUSCULAR | Status: DC | PRN

## 2014-11-13 MED ORDER — LACTATED RINGERS IV SOLN
INTRAVENOUS | Status: DC | PRN
  Administered 2014-11-13 (×2): via INTRAVENOUS

## 2014-11-13 MED ORDER — SODIUM CHLORIDE 0.9 % IJ SOLN
INTRAMUSCULAR | Status: AC
  Filled 2014-11-13: qty 10

## 2014-11-13 MED ORDER — OXYCODONE HCL 5 MG/5ML PO SOLN: 5.0000 mg | Freq: Once | ORAL | Status: DC | PRN

## 2014-11-13 MED ORDER — PROPOFOL 10 MG/ML IV BOLUS
INTRAVENOUS | Status: AC
  Filled 2014-11-13: qty 20

## 2014-11-13 MED ORDER — LIDOCAINE HCL (CARDIAC) 20 MG/ML IV SOLN
INTRAVENOUS | Status: DC | PRN
  Administered 2014-11-13: 30 mg via INTRAVENOUS

## 2014-11-13 MED ORDER — CEFAZOLIN SODIUM 1 G IJ SOLR
INTRAMUSCULAR | Status: AC
  Filled 2014-11-13: qty 10

## 2014-11-13 MED ORDER — CHLORHEXIDINE GLUCONATE 4 % EX LIQD: 60.0000 mL | Freq: Once | CUTANEOUS | Status: DC

## 2014-11-13 MED ORDER — OXYCODONE HCL 5 MG PO TABS: 5.0000 mg | ORAL_TABLET | Freq: Once | ORAL | Status: DC | PRN

## 2014-11-13 MED ORDER — ONDANSETRON HCL 4 MG/2ML IJ SOLN
INTRAMUSCULAR | Status: DC | PRN
  Administered 2014-11-13: 4 mg via INTRAVENOUS

## 2014-11-13 MED ORDER — ONDANSETRON HCL 4 MG/2ML IJ SOLN
INTRAMUSCULAR | Status: AC
  Filled 2014-11-13: qty 2

## 2014-11-13 MED ORDER — MIDAZOLAM HCL 2 MG/2ML IJ SOLN
INTRAMUSCULAR | Status: AC
  Filled 2014-11-13: qty 4

## 2014-11-13 MED ORDER — FENTANYL CITRATE (PF) 100 MCG/2ML IJ SOLN
INTRAMUSCULAR | Status: AC
  Filled 2014-11-13: qty 2

## 2014-11-13 MED ORDER — LIDOCAINE HCL (CARDIAC) 20 MG/ML IV SOLN
INTRAVENOUS | Status: AC
  Filled 2014-11-13: qty 5

## 2014-11-13 MED ORDER — BUPIVACAINE HCL (PF) 0.25 % IJ SOLN
INTRAMUSCULAR | Status: AC
  Filled 2014-11-13: qty 30

## 2014-11-13 MED ORDER — CEFAZOLIN SODIUM-DEXTROSE 2-3 GM-% IV SOLR
2.0000 g | INTRAVENOUS | Status: AC
  Administered 2014-11-13: 2 g via INTRAVENOUS

## 2014-11-13 MED ORDER — DEXAMETHASONE SODIUM PHOSPHATE 10 MG/ML IJ SOLN
INTRAMUSCULAR | Status: AC
  Filled 2014-11-13: qty 1

## 2014-11-13 MED ORDER — MIDAZOLAM HCL 2 MG/2ML IJ SOLN
INTRAMUSCULAR | Status: AC
  Filled 2014-11-13: qty 2

## 2014-11-13 SURGICAL SUPPLY — 63 items
APL SKNCLS STERI-STRIP NONHPOA (GAUZE/BANDAGES/DRESSINGS)
BANDAGE ELASTIC 3 VELCRO ST LF (GAUZE/BANDAGES/DRESSINGS) ×4 IMPLANT
BANDAGE ELASTIC 4 VELCRO ST LF (GAUZE/BANDAGES/DRESSINGS) IMPLANT
BENZOIN TINCTURE PRP APPL 2/3 (GAUZE/BANDAGES/DRESSINGS) IMPLANT
BLADE MINI RND TIP GREEN BEAV (BLADE) IMPLANT
BLADE SURG 15 STRL LF DISP TIS (BLADE) ×4 IMPLANT
BLADE SURG 15 STRL SS (BLADE) ×8
BNDG CMPR 9X4 STRL LF SNTH (GAUZE/BANDAGES/DRESSINGS) ×2
BNDG CMPR MD 5X2 ELC HKLP STRL (GAUZE/BANDAGES/DRESSINGS)
BNDG ELASTIC 2 VLCR STRL LF (GAUZE/BANDAGES/DRESSINGS) IMPLANT
BNDG ESMARK 4X9 LF (GAUZE/BANDAGES/DRESSINGS) ×4 IMPLANT
BNDG GAUZE ELAST 4 BULKY (GAUZE/BANDAGES/DRESSINGS) ×4 IMPLANT
CLOSURE WOUND 1/2 X4 (GAUZE/BANDAGES/DRESSINGS)
CORDS BIPOLAR (ELECTRODE) ×4 IMPLANT
COVER BACK TABLE 60X90IN (DRAPES) ×4 IMPLANT
CUFF TOURNIQUET SINGLE 18IN (TOURNIQUET CUFF) ×4 IMPLANT
DECANTER SPIKE VIAL GLASS SM (MISCELLANEOUS) IMPLANT
DRAPE EXTREMITY T 121X128X90 (DRAPE) ×4 IMPLANT
DRAPE SURG 17X23 STRL (DRAPES) ×4 IMPLANT
DURAPREP 26ML APPLICATOR (WOUND CARE) ×4 IMPLANT
GAUZE SPONGE 4X4 12PLY STRL (GAUZE/BANDAGES/DRESSINGS) ×4 IMPLANT
GAUZE XEROFORM 1X8 LF (GAUZE/BANDAGES/DRESSINGS) ×4 IMPLANT
GLOVE BIO SURGEON STRL SZ 6.5 (GLOVE) ×3 IMPLANT
GLOVE BIO SURGEONS STRL SZ 6.5 (GLOVE) ×1
GLOVE BIOGEL PI IND STRL 7.0 (GLOVE) ×2 IMPLANT
GLOVE BIOGEL PI INDICATOR 7.0 (GLOVE) ×2
GLOVE SURG SYN 8.0 (GLOVE) ×4 IMPLANT
GOWN STRL REUS W/ TWL LRG LVL3 (GOWN DISPOSABLE) ×2 IMPLANT
GOWN STRL REUS W/TWL LRG LVL3 (GOWN DISPOSABLE) ×4
GOWN STRL REUS W/TWL XL LVL3 (GOWN DISPOSABLE) ×4 IMPLANT
NDL SAFETY ECLIPSE 18X1.5 (NEEDLE) IMPLANT
NEEDLE HYPO 18GX1.5 SHARP (NEEDLE)
NEEDLE HYPO 25X1 1.5 SAFETY (NEEDLE) IMPLANT
NS IRRIG 1000ML POUR BTL (IV SOLUTION) ×4 IMPLANT
PACK BASIN DAY SURGERY FS (CUSTOM PROCEDURE TRAY) ×4 IMPLANT
PAD CAST 3X4 CTTN HI CHSV (CAST SUPPLIES) ×2 IMPLANT
PADDING CAST ABS 4INX4YD NS (CAST SUPPLIES)
PADDING CAST ABS COTTON 4X4 ST (CAST SUPPLIES) IMPLANT
PADDING CAST COTTON 3X4 STRL (CAST SUPPLIES) ×4
PADDING UNDERCAST 2 STRL (CAST SUPPLIES)
PADDING UNDERCAST 2X4 STRL (CAST SUPPLIES) IMPLANT
SHEET MEDIUM DRAPE 40X70 STRL (DRAPES) ×4 IMPLANT
SLING ARM FOAM STRAP LRG (SOFTGOODS) ×4 IMPLANT
SPLINT PLASTER CAST XFAST 4X15 (CAST SUPPLIES) ×30 IMPLANT
SPLINT PLASTER XTRA FAST SET 4 (CAST SUPPLIES) ×30
STOCKINETTE 4X48 STRL (DRAPES) ×4 IMPLANT
STRIP CLOSURE SKIN 1/2X4 (GAUZE/BANDAGES/DRESSINGS) IMPLANT
SUT ETHILON 3 0 PS 1 (SUTURE) ×8 IMPLANT
SUT ETHILON 5 0 PS 2 18 (SUTURE) IMPLANT
SUT FIBERWIRE 3-0 18 TAPR NDL (SUTURE) ×8
SUT PROLENE 3 0 PS 2 (SUTURE) IMPLANT
SUT VIC AB 2-0 SH 27 (SUTURE)
SUT VIC AB 2-0 SH 27XBRD (SUTURE) IMPLANT
SUT VICRYL RAPIDE 4-0 (SUTURE) IMPLANT
SUT VICRYL RAPIDE 4/0 PS 2 (SUTURE) IMPLANT
SUTURE FIBERWR 3-0 18 TAPR NDL (SUTURE) ×4 IMPLANT
SYR 5ML LL (SYRINGE) IMPLANT
SYR BULB 3OZ (MISCELLANEOUS) ×4 IMPLANT
SYR CONTROL 10ML LL (SYRINGE) IMPLANT
SYRINGE 10CC LL (SYRINGE) IMPLANT
TOWEL OR 17X24 6PK STRL BLUE (TOWEL DISPOSABLE) ×4 IMPLANT
TUBE FEEDING 5FR 15 INCH (TUBING) IMPLANT
UNDERPAD 30X30 (UNDERPADS AND DIAPERS) ×4 IMPLANT

## 2014-11-13 NOTE — Anesthesia Preprocedure Evaluation (Signed)
Anesthesia Evaluation  Patient identified by MRN, date of birth, ID band Patient awake    Reviewed: Allergy & Precautions, NPO status , Patient's Chart, lab work & pertinent test results  Airway Mallampati: I  TM Distance: >3 FB Neck ROM: Full    Dental  (+) Dental Advisory Given, Partial Upper   Pulmonary Current Smoker,    breath sounds clear to auscultation       Cardiovascular  Rhythm:Regular Rate:Normal     Neuro/Psych    GI/Hepatic   Endo/Other    Renal/GU      Musculoskeletal   Abdominal   Peds  Hematology   Anesthesia Other Findings   Reproductive/Obstetrics                            Anesthesia Physical Anesthesia Plan  ASA: II  Anesthesia Plan: General   Post-op Pain Management: GA combined w/ Regional for post-op pain   Induction: Intravenous  Airway Management Planned: LMA  Additional Equipment:   Intra-op Plan:   Post-operative Plan:   Informed Consent: I have reviewed the patients History and Physical, chart, labs and discussed the procedure including the risks, benefits and alternatives for the proposed anesthesia with the patient or authorized representative who has indicated his/her understanding and acceptance.   Dental advisory given  Plan Discussed with: CRNA and Anesthesiologist  Anesthesia Plan Comments:         Anesthesia Quick Evaluation

## 2014-11-13 NOTE — Transfer of Care (Signed)
Immediate Anesthesia Transfer of Care Note  Patient: Allison [name]  Procedure(s) Performed: Procedure(s): LEFT WRIST MANIPULATION, LEFT THUMB INTERPHALANGEAL JOINT MANIPULATION  (Left) METACARPAL JOINT RELEASE LEFT INDEX,MIDDLE,RING, AND SMALL FINGERS (Left)  Patient Location: PACU  Anesthesia Type:GA combined with regional for post-op pain  Level of Consciousness: awake and patient cooperative  Airway & Oxygen Therapy: Patient Spontanous Breathing and Patient connected to face mask oxygen  Post-op Assessment: Report given to RN and Post -op Vital signs reviewed and stable  Post vital signs: Reviewed and stable  Last Vitals:  Filed Vitals:   11/13/14 0748  BP:   Pulse:   Temp:   Resp: 21    Complications: No apparent anesthesia complications

## 2014-11-13 NOTE — Anesthesia Procedure Notes (Addendum)
Anesthesia Regional Block:  Supraclavicular block  Pre-Anesthetic Checklist: ,, timeout performed, Correct Patient, Correct Site, Correct Laterality, Correct Procedure, Correct Position, site marked, Risks and benefits discussed,  Surgical consent,  Pre-op evaluation,  At surgeon's request and post-op pain management  Laterality: Left  Prep: chloraprep       Needles:  Injection technique: Single-shot  Needle Type: Echogenic Stimulator Needle     Needle Length: 9cm 9 cm Needle Gauge: 21 and 21 G    Additional Needles:  Procedures: ultrasound guided (picture in chart) Supraclavicular block Narrative:  Start time: 11/13/2014 7:40 AM End time: 11/13/2014 7:45 AM Injection made incrementally with aspirations every 5 mL.  Performed by: Personally   Additional Notes: 15 cc 0.5% Marcaine 1:200 Epi injected easily   Procedure Name: LMA Insertion Date/Time: 11/13/2014 7:59 AM Performed by: Genevieve NorlanderLINKA, Render Marley L Pre-anesthesia Checklist: Patient identified, Emergency Drugs available, Suction available, Patient being monitored and Timeout performed Patient Re-evaluated:Patient Re-evaluated prior to inductionOxygen Delivery Method: Circle System Utilized Preoxygenation: Pre-oxygenation with 100% oxygen Intubation Type: IV induction Ventilation: Mask ventilation without difficulty LMA: LMA inserted LMA Size: 4.0 Number of attempts: 1 Airway Equipment and Method: Bite block Placement Confirmation: positive ETCO2 Tube secured with: Tape Dental Injury: Teeth and Oropharynx as per pre-operative assessment

## 2014-11-13 NOTE — Progress Notes (Signed)
Assisted Dr. Joslin with left, ultrasound guided, supraclavicular block. Side rails up, monitors on throughout procedure. See vital signs in flow sheet. Tolerated Procedure well. 

## 2014-11-13 NOTE — Op Note (Signed)
See note [id number]

## 2014-11-13 NOTE — Discharge Instructions (Signed)
° ° °  Regional Anesthesia Blocks ° °1. Numbness or the inability to move the "blocked" extremity may last from 3-48 hours after placement. The length of time depends on the medication injected and your individual response to the medication. If the numbness is not going away after 48 hours, call your surgeon. ° °2. The extremity that is blocked will need to be protected until the numbness is gone and the  Strength has returned. Because you cannot feel it, you will need to take extra care to avoid injury. Because it may be weak, you may have difficulty moving it or using it. You may not know what position it is in without looking at it while the block is in effect. ° °3. For blocks in the legs and feet, returning to weight bearing and walking needs to be done carefully. You will need to wait until the numbness is entirely gone and the strength has returned. You should be able to move your leg and foot normally before you try and bear weight or walk. You will need someone to be with you when you first try to ensure you do not fall and possibly risk injury. ° °4. Bruising and tenderness at the needle site are common side effects and will resolve in a few days. ° °5. Persistent numbness or new problems with movement should be communicated to the surgeon or the [facility] ([phone])/ [facility] ([phone]). ° ° ° °Post Anesthesia Home Care Instructions ° °Activity: °Get plenty of rest for the remainder of the day. A responsible adult should stay with you for 24 hours following the procedure.  °For the next 24 hours, DO NOT: °-Drive a car °-Operate machinery °-Drink alcoholic beverages °-Take any medication unless instructed by your physician °-Make any legal decisions or sign important papers. ° °Meals: °Start with liquid foods such as gelatin or soup. Progress to regular foods as tolerated. Avoid greasy, spicy, heavy foods. If nausea and/or vomiting occur, drink only clear liquids until  the nausea and/or vomiting subsides. Call your physician if vomiting continues. ° °Special Instructions/Symptoms: °Your throat may feel dry or sore from the anesthesia or the breathing tube placed in your throat during surgery. If this causes discomfort, gargle with warm salt water. The discomfort should disappear within 24 hours. ° °If you had a scopolamine patch placed behind your ear for the management of post- operative nausea and/or vomiting: ° °1. The medication in the patch is effective for 72 hours, after which it should be removed.  Wrap patch in a tissue and discard in the trash. Wash hands thoroughly with soap and water. °2. You may remove the patch earlier than 72 hours if you experience unpleasant side effects which may include dry mouth, dizziness or visual disturbances. °3. Avoid touching the patch. Wash your hands with soap and water after contact with the patch. °  ° °

## 2014-11-13 NOTE — Op Note (Deleted)
NAME:  Allison [name], Allison [name]              ACCOUNT NO.:  [id number]  MEDICAL RECORD NO.:  [id number]  LOCATION:                               FACILITY:  [facility]  PHYSICIAN:  Orlandria Kissner A. Lemar Bakos, M.D.DATE OF BIRTH:  09/08/1970  DATE OF PROCEDURE:  11/13/2014 DATE OF DISCHARGE:  11/13/2014                              OPERATIVE REPORT   PREOPERATIVE DIAGNOSIS:  Left hand and wrist posttraumatic contracture.  POSTOPERATIVE DIAGNOSIS:  Left hand and wrist posttraumatic contracture.  PROCEDURE:  Left wrist manipulation, left thumb MP joint manipulation, left index long ring and small proximal interphalangeal joint manipulation, and open index, long, ring, and small metacarpophalangeal capsulectomies with tenolysis, extensor tendon.  SURGEON:  Jalynn Waddell A. Marvette Schamp, MD  ASSISTANT:  None.  ANESTHESIA:  Axial block and general.  COMPLICATION:  No complication.  DRAINS:  No drains.  PROCEDURE IN DETAIL:  The patient was taken to the operating suite. After induction of general anesthetic and axial block analgesia, left upper extremity was prepped and draped in sterile fashion.  An Esmarch was used to exsanguinate the limb.  Tourniquet was then inflated to 250 mmHg.  At this point in time, a gentle manipulation of the wrist was undertaken with significant improvement in both flexion and extension arc.  We also did a gentle manipulation of the thumb, metacarpophalangeal joint, and the index, long, ring, and small proximal interphalangeal joint.  We then did open capsulectomies of the index, long, ring, and small fingers.  We incised longitudinally over the metacarpophalangeal joint.  We split the extensor tendon.  We excised the dorsal capsule, did partial release of the collateral ligaments on all of the digits, was able to get full flexion, we then did limited tenolysis of the extensor mechanism under the skin for all those digits. We then thoroughly irrigated.  We loosely approximated the  extensor mechanism with a running locked 3-0 FiberWire suture followed by 3-0 nylon on the skin.  Xeroform, 4x4s, fluffs, and a compressive dressing was applied as well as a dorsal splint with the MP joints flexed to 90 degrees.  The patient tolerated all procedures well in a concealed fashion.     Isabeau Mccalla A. Romey Cohea, M.D.   ______________________________ Summerlyn Fickel A. Nazaiah Navarrete, M.D.    MAW/MEDQ  D:  11/13/2014  T:  11/13/2014  Job:  [id number] 

## 2014-11-13 NOTE — H&P (Signed)
Allison [name] is an 44 y.o. female.   Chief Complaint: left wrist and hand loss of motion HPI: as above s/p dog bite with severe contracture of digits and wrist  Past Medical History  Diagnosis Date  . Thyroid disease   . Hypothyroidism     Past Surgical History  Procedure Laterality Date  . Complex wound closure Left 03/29/2014    Procedure: CLOSURE MULTIPLE LACERATIONS LEFT LEG AND Left ARM, Embolectomy., LIGATION OF RADIAL ARTERY;  Surgeon: Allison SchmidtJay Wyatt, MD;  [facility];  Service: General;  Laterality: Left;  . I&d extremity Left 03/29/2014    Procedure: IRRIGATION AND DEBRIDEMENT CLOSURE MULTIPLE LACERATIONS HAND AND ARM, lower legs;  Surgeon: Allison SchmidtJay Wyatt, MD;  [facility];  Service: General;  Laterality: Left;  . Dressing change under anesthesia Left 04/01/2014    Procedure: DRESSING CHANGE LEFT UPPER EXTERMITY AND LEFT HAND UNDER ANESTHESIA;  Surgeon: Allison PonderMatthew Albie Arizpe, MD;  Location: [facility] OR;  Service: Orthopedics;  Laterality: Left;  . Abdominal hysterectomy    . Removed part of ovary      Family History  Problem Relation Age of Onset  . Breast cancer Mother [id number]  . Cancer Mother     Breast  . Heart attack Father    Social History:  reports that she has been smoking Cigarettes.  She has been smoking about 1.00 pack per day. She has never used smokeless tobacco. She reports that she drinks alcohol. She reports that she does not use illicit drugs.  Allergies: No Known Allergies   (Not in [name] hospital admission)  No results found for this or any previous visit (from the past 48 hour(s)). No results found.  Review of Systems  All other systems reviewed and are negative.   There were no vitals taken for this visit. Physical Exam  Constitutional: She is oriented to person, place, and time. She appears well-developed and well-nourished.  HENT:  Head: Normocephalic and atraumatic.  Cardiovascular: Normal rate.   Respiratory: Effort normal.  Musculoskeletal:       Left  wrist: She exhibits tenderness and deformity.       Left hand: She exhibits tenderness and swelling.  S/p dog bite with joint contractures  Neurological: She is alert and oriented to person, place, and time.  Skin: Skin is warm.  Psychiatric: She has [name] normal mood and affect. Her behavior is normal. Judgment and thought content normal.     Assessment/Plan As above  Plan joint releases as needed  Allison [name] 11/13/2014, 7:34 AM

## 2014-11-13 NOTE — Addendum Note (Signed)
Addendum  created 11/13/14 1103 by Clorine Swing L Yanira Tolsma, CRNA   Modules edited: Anesthesia Review and Sign Navigator Section    

## 2014-11-13 NOTE — Anesthesia Postprocedure Evaluation (Signed)
  Anesthesia Post-op Note  Patient: Deondria Mcallister  Procedure(s) Performed: Procedure(s): LEFT WRIST MANIPULATION, LEFT THUMB INTERPHALANGEAL JOINT MANIPULATION  (Left) METACARPAL JOINT RELEASE LEFT INDEX,MIDDLE,RING, AND SMALL FINGERS (Left)  Patient Location: PACU  Anesthesia Type:General and GA combined with regional for post-op pain  Level of Consciousness: awake, alert  and oriented  Airway and Oxygen Therapy: Patient Spontanous Breathing  Post-op Pain: none  Post-op Assessment: Post-op Vital signs reviewed, Patient's Cardiovascular Status Stable, Respiratory Function Stable, Patent Airway and Pain level controlled              Post-op Vital Signs: stable  Last Vitals:  Filed Vitals:   11/13/14 0930  BP: 130/77  Pulse: 58  Temp:   Resp: 20    Complications: No apparent anesthesia complications

## 2014-11-13 NOTE — Op Note (Signed)
NAMHunt Oris:  Allison [name], Allison [name]              ACCOUNT NO.:  [id number]  MEDICAL RECORD NO.:  [id number]  LOCATION:                               FACILITY:  [facility]  PHYSICIAN:  Artist PaisMatthew A. Brisa Auth, M.D.DATE OF BIRTH:  10/09/1970  DATE OF PROCEDURE:  11/13/2014 DATE OF DISCHARGE:  11/13/2014                              OPERATIVE REPORT   PREOPERATIVE DIAGNOSIS:  Left hand and wrist posttraumatic contracture.  POSTOPERATIVE DIAGNOSIS:  Left hand and wrist posttraumatic contracture.  PROCEDURE:  Left wrist manipulation, left thumb MP joint manipulation, left index long ring and small proximal interphalangeal joint manipulation, and open index, long, ring, and small metacarpophalangeal capsulectomies with tenolysis, extensor tendon.  SURGEON:  Artist PaisMatthew A. Mina MarbleWeingold, MD  ASSISTANT:  None.  ANESTHESIA:  Axial block and general.  COMPLICATION:  No complication.  DRAINS:  No drains.  PROCEDURE IN DETAIL:  The patient was taken to the operating suite. After induction of general anesthetic and axial block analgesia, left upper extremity was prepped and draped in sterile fashion.  An Esmarch was used to exsanguinate the limb.  Tourniquet was then inflated to 250 mmHg.  At this point in time, a gentle manipulation of the wrist was undertaken with significant improvement in both flexion and extension arc.  We also did a gentle manipulation of the thumb, metacarpophalangeal joint, and the index, long, ring, and small proximal interphalangeal joint.  We then did open capsulectomies of the index, long, ring, and small fingers.  We incised longitudinally over the metacarpophalangeal joint.  We split the extensor tendon.  We excised the dorsal capsule, did partial release of the collateral ligaments on all of the digits, was able to get full flexion, we then did limited tenolysis of the extensor mechanism under the skin for all those digits. We then thoroughly irrigated.  We loosely approximated the  extensor mechanism with a running locked 3-0 FiberWire suture followed by 3-0 nylon on the skin.  Xeroform, 4x4s, fluffs, and a compressive dressing was applied as well as a dorsal splint with the MP joints flexed to 90 degrees.  The patient tolerated all procedures well in a concealed fashion.     Artist PaisMatthew A. Mina MarbleWeingold, M.D.   ______________________________ Artist PaisMatthew A. Mina MarbleWeingold, M.D.    MAW/MEDQ  D:  11/13/2014  T:  11/13/2014  Job:  [id number]

## 2014-11-14 ENCOUNTER — Encounter (HOSPITAL_BASED_OUTPATIENT_CLINIC_OR_DEPARTMENT_OTHER): Payer: Self-pay | Admitting: Orthopedic Surgery

## 2014-11-21 ENCOUNTER — Other Ambulatory Visit (HOSPITAL_COMMUNITY): Payer: Self-pay

## 2014-11-21 ENCOUNTER — Ambulatory Visit: Payer: Self-pay | Admitting: Vascular Surgery

## 2015-02-10 IMAGING — MG MM DIGITAL DIAGNOSTIC BILAT CAD
5 series · 5 of 5 positions shown · non-contrast
Comparison: Mammography 11/15/2012 (left), 10/27/2012 (bilateral),
05/29/2010 (bilateral).

CLINICAL DATA: Tender palpable lump in the upper outer quadrant of
the left breast which the patient states she initially noticed
approximately 6 months ago, without interval change. Annual
evaluation, right breast.

EXAM:
DIGITAL DIAGNOSTIC BILATERAL MAMMOGRAM WITH CAD
ULTRASOUND LEFT BREAST

[R CC]
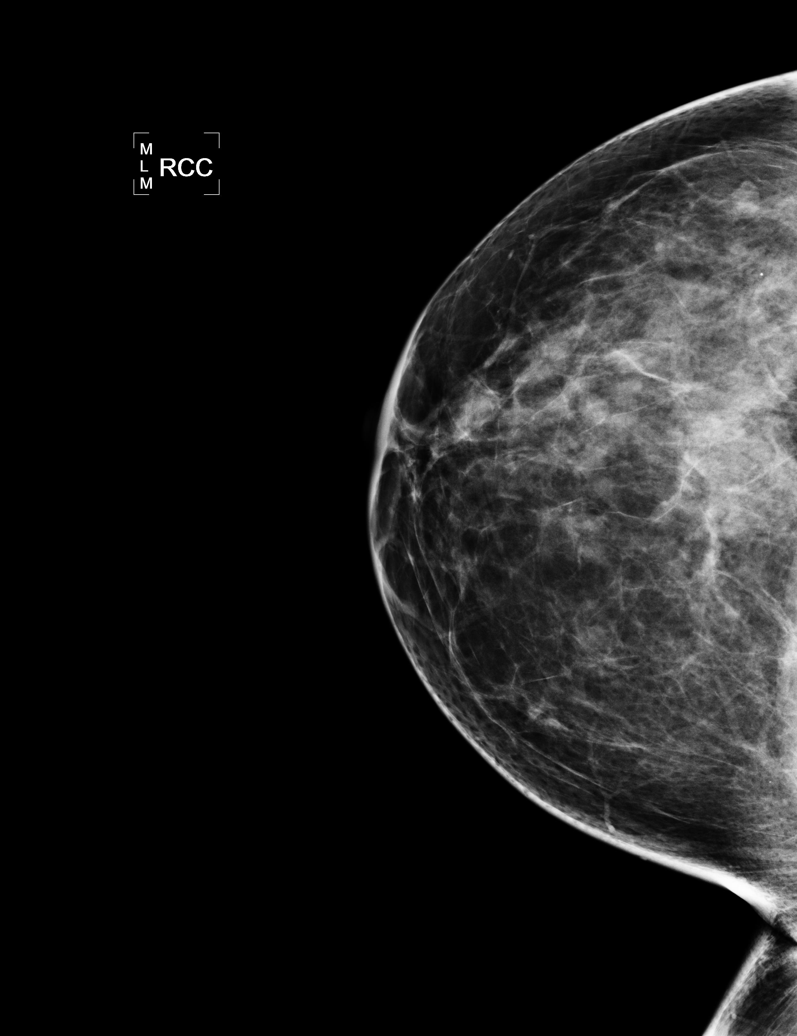

[L CC]
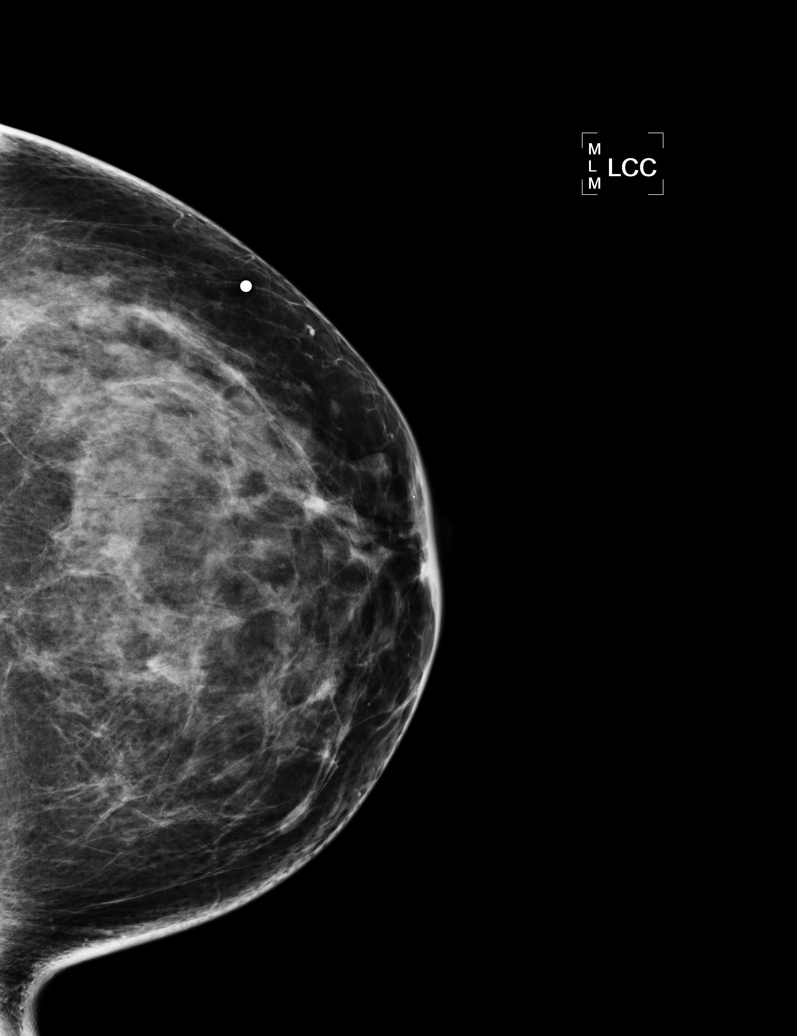

[L MLO]
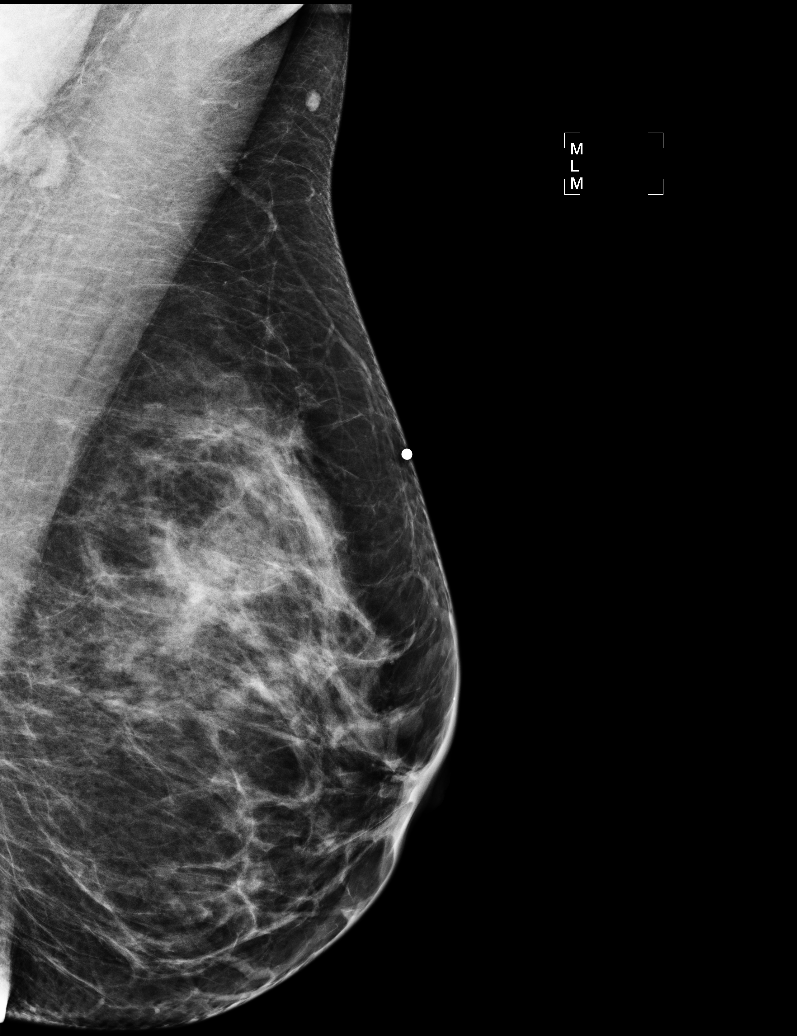

[R MLO]
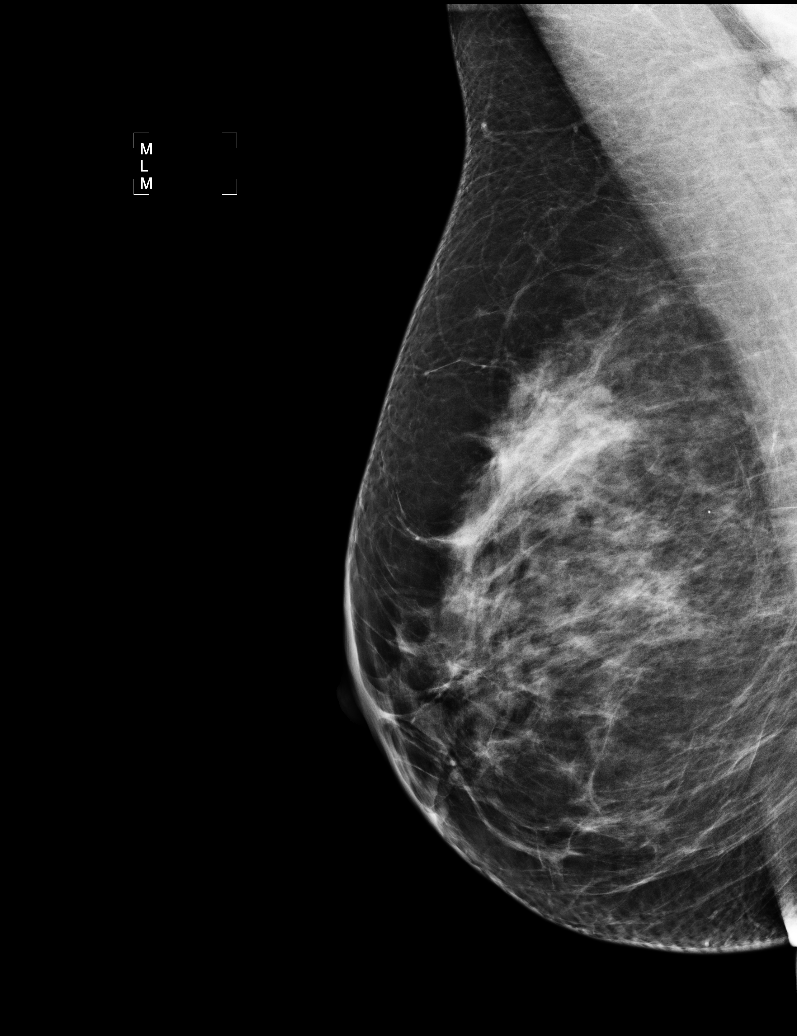

[L TAN]
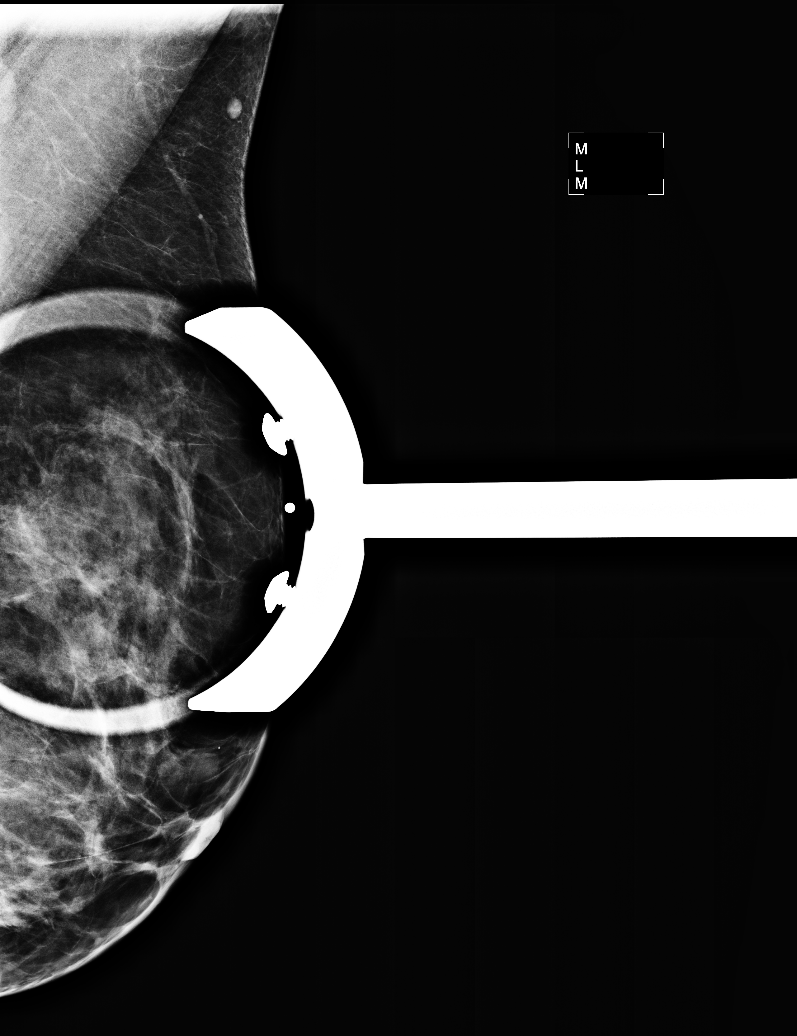

[5 of 5 positions shown; findings below may reference images not displayed]

Left breast ultrasound 11/15/2012.

ACR Breast Density Category c: The breast tissue is heterogeneously
dense, which may obscure small masses.
FINDINGS: CC and MLO views of both breasts and a spot tangential view of the
area of palpable concern in the left breast were obtained. No
findings suspicious for malignancy in either breast. Specifically,
no mammographic abnormality in the area of palpable concern in the
upper outer left breast.

Mammographic images were processed with CAD.

On physical exam, there is no palpable mass in the upper outer
quadrant of the left breast. The patient does describe focal
tenderness to palpation.

Ultrasound is performed, showing that the previously identified
simple cyst at the 1 o'clock position of the left breast
approximately 5 cm from the nipple has slightly decreased in size
since the prior ultrasound, now measuring approximately 4 x 2 x 4 mm
(previously 5 x 3 x 5 mm). Normal dense fibroglandular tissue is
present throughout the remainder of the upper inner quadrant of the
left breast. No suspicious solid mass or abnormal acoustic shadowing
was identified.
IMPRESSION: 1. No mammographic or sonographic evidence of malignancy, left
breast.
2. Interval slight decrease in size of the previously identified
simple cyst at the 1 o'clock left breast approximately 5 cm from the
nipple.
3. No mammographic evidence of malignancy, right breast.

RECOMMENDATION:
Screening mammogram in one year.(Code:SQ-2-BTZ)

Strategies for alleviating breast pain including decreasing caffeine
intake and vitamin-E supplementation were discussed with the
patient. The importance of monthly self breast examination and an
annual clinical breast examination was discussed with the patient.

I have discussed the findings and recommendations with the patient.
Results were also provided in writing at the conclusion of the
visit. If applicable, a reminder letter will be sent to the patient
regarding the next appointment.

BI-RADS CATEGORY  2: Benign.

## 2015-02-10 IMAGING — US US BREAST*L* LIMITED INC AXILLA
1 series · 5 of 5 positions shown · non-contrast
Comparison: Mammography 11/15/2012 (left), 10/27/2012 (bilateral),
05/29/2010 (bilateral).

CLINICAL DATA: Tender palpable lump in the upper outer quadrant of
the left breast which the patient states she initially noticed
approximately 6 months ago, without interval change. Annual
evaluation, right breast.

EXAM:
DIGITAL DIAGNOSTIC BILATERAL MAMMOGRAM WITH CAD
ULTRASOUND LEFT BREAST

[Series 1: us breast*left* limited inc axilla · 5 of 5 slices shown]
[im 1/5]
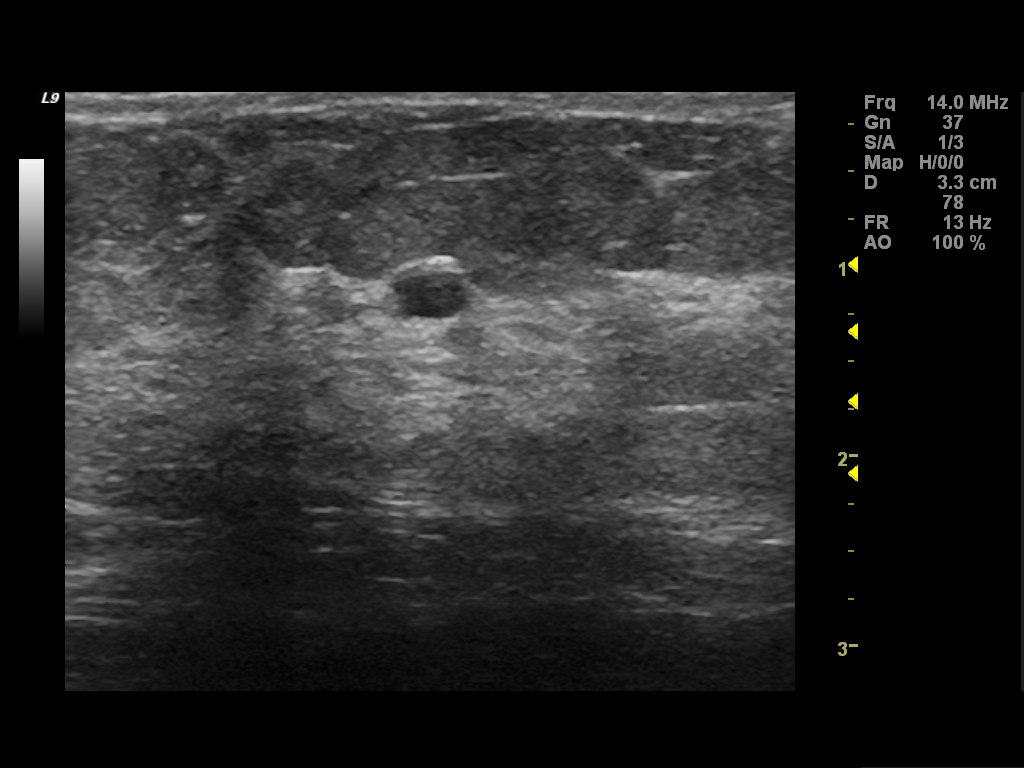
[im 2/5]
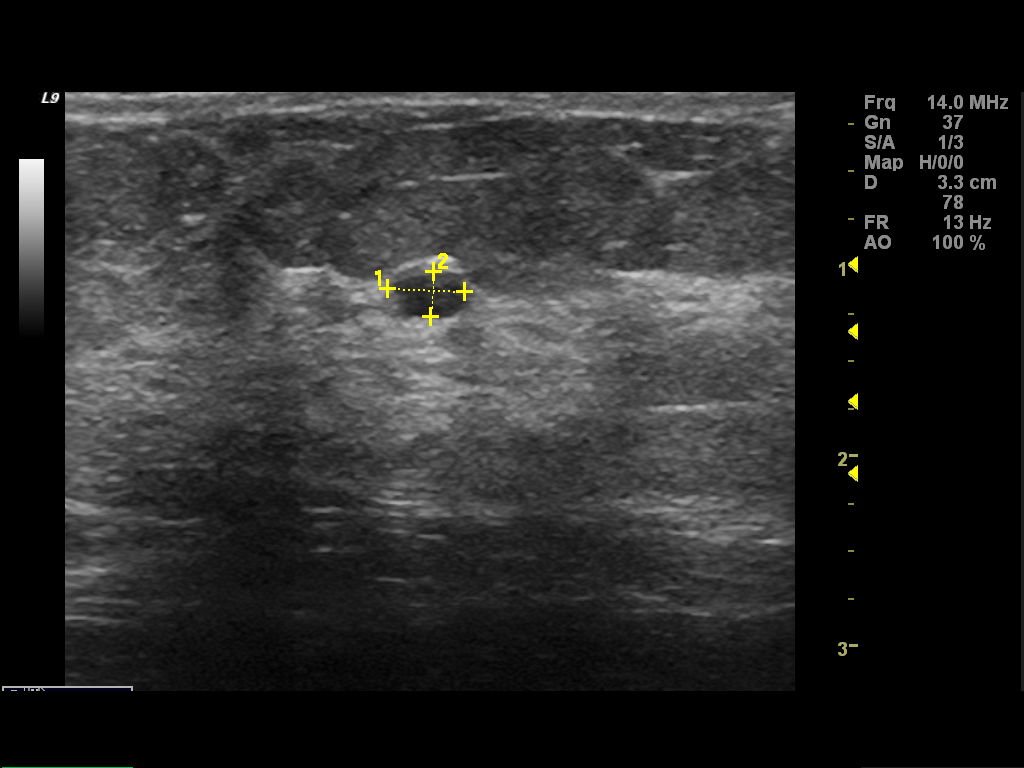
[im 3/5]
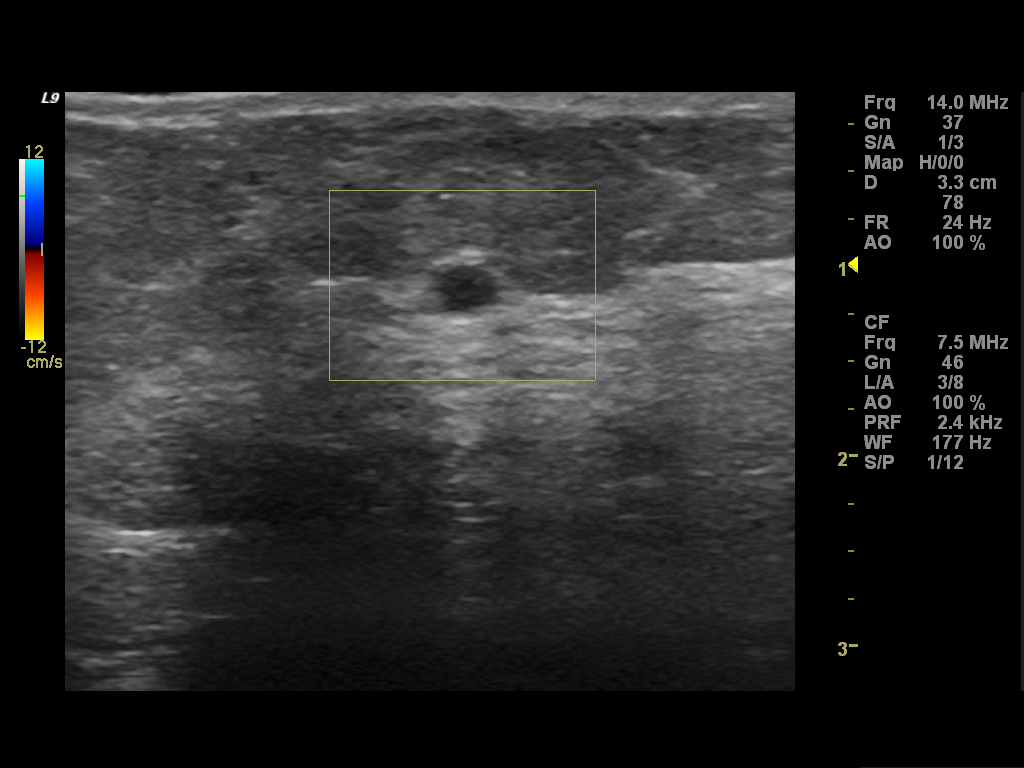
[im 4/5]
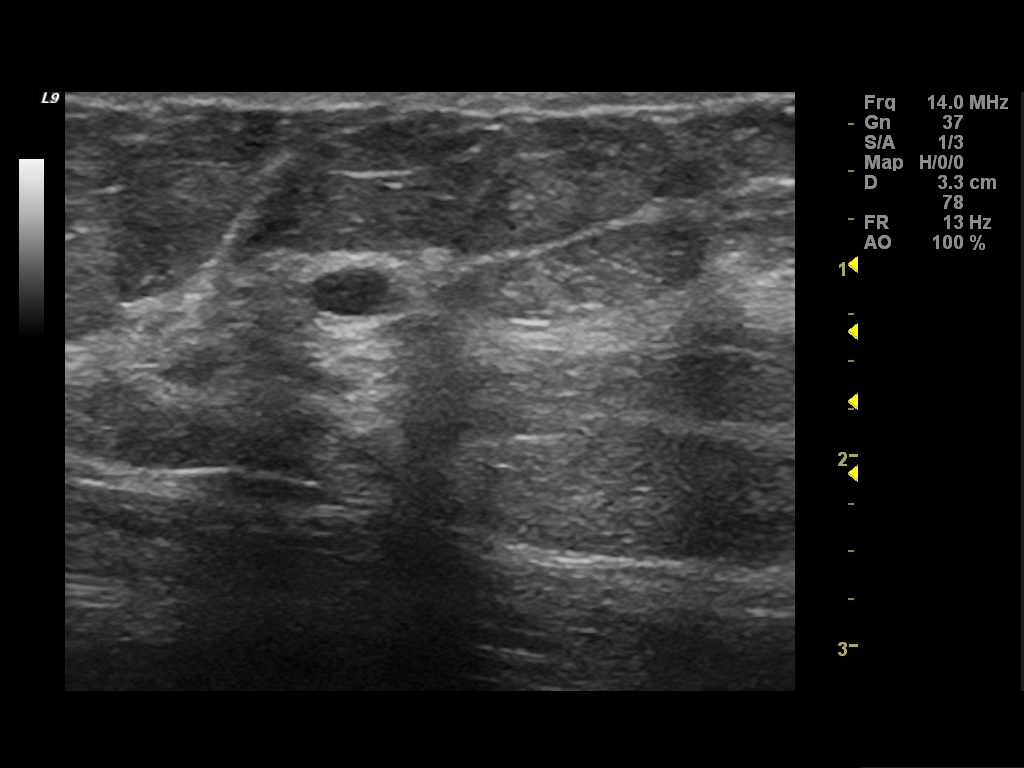
[im 5/5]
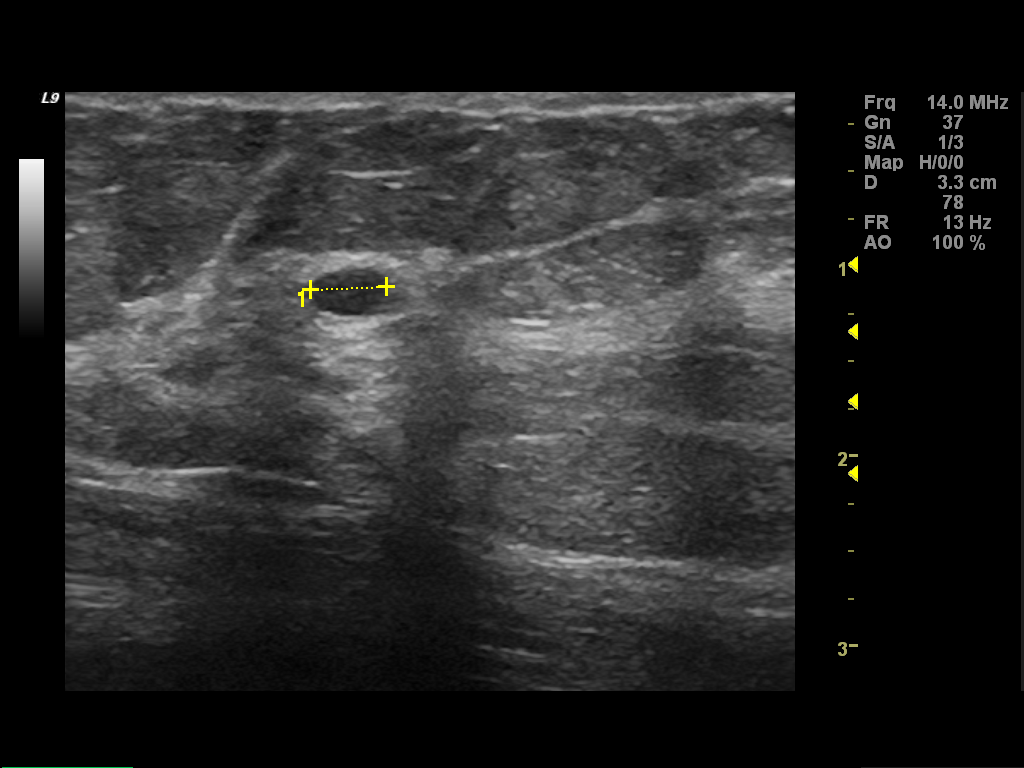

[5 of 5 positions shown; findings below may reference images not displayed]

Left breast ultrasound 11/15/2012.

ACR Breast Density Category c: The breast tissue is heterogeneously
dense, which may obscure small masses.
FINDINGS: CC and MLO views of both breasts and a spot tangential view of the
area of palpable concern in the left breast were obtained. No
findings suspicious for malignancy in either breast. Specifically,
no mammographic abnormality in the area of palpable concern in the
upper outer left breast.

Mammographic images were processed with CAD.

On physical exam, there is no palpable mass in the upper outer
quadrant of the left breast. The patient does describe focal
tenderness to palpation.

Ultrasound is performed, showing that the previously identified
simple cyst at the 1 o'clock position of the left breast
approximately 5 cm from the nipple has slightly decreased in size
since the prior ultrasound, now measuring approximately 4 x 2 x 4 mm
(previously 5 x 3 x 5 mm). Normal dense fibroglandular tissue is
present throughout the remainder of the upper inner quadrant of the
left breast. No suspicious solid mass or abnormal acoustic shadowing
was identified.
IMPRESSION: 1. No mammographic or sonographic evidence of malignancy, left
breast.
2. Interval slight decrease in size of the previously identified
simple cyst at the 1 o'clock left breast approximately 5 cm from the
nipple.
3. No mammographic evidence of malignancy, right breast.

RECOMMENDATION:
Screening mammogram in one year.(Code:SQ-2-BTZ)

Strategies for alleviating breast pain including decreasing caffeine
intake and vitamin-E supplementation were discussed with the
patient. The importance of monthly self breast examination and an
annual clinical breast examination was discussed with the patient.

I have discussed the findings and recommendations with the patient.
Results were also provided in writing at the conclusion of the
visit. If applicable, a reminder letter will be sent to the patient
regarding the next appointment.

BI-RADS CATEGORY  2: Benign.

## 2015-05-04 ENCOUNTER — Encounter: Payer: Self-pay | Admitting: Family

## 2015-05-15 ENCOUNTER — Ambulatory Visit (INDEPENDENT_AMBULATORY_CARE_PROVIDER_SITE_OTHER): Payer: BLUE CROSS/BLUE SHIELD | Admitting: Family

## 2015-05-15 ENCOUNTER — Ambulatory Visit (HOSPITAL_COMMUNITY)
Admission: RE | Admit: 2015-05-15 | Discharge: 2015-05-15 | Disposition: A | Discharge status: Home / Self Care | Payer: BLUE CROSS/BLUE SHIELD | Source: Ambulatory Visit | Attending: Family | Admitting: Family

## 2015-05-15 ENCOUNTER — Encounter: Payer: Self-pay | Admitting: Family

## 2015-05-15 VITALS — BP 118/72 | HR 65 | Ht 63.0 in | Wt 121.9 lb

## 2015-05-15 DIAGNOSIS — Z720 Tobacco use: Secondary | ICD-10-CM

## 2015-05-15 DIAGNOSIS — F17200 Nicotine dependence, unspecified, uncomplicated: Secondary | ICD-10-CM

## 2015-05-15 DIAGNOSIS — S55102D Unspecified injury of radial artery at forearm level, left arm, subsequent encounter: Secondary | ICD-10-CM | POA: Diagnosis not present

## 2015-05-15 DIAGNOSIS — Z48812 Encounter for surgical aftercare following surgery on the circulatory system: Secondary | ICD-10-CM | POA: Diagnosis not present

## 2015-05-15 DIAGNOSIS — W540XXD Bitten by dog, subsequent encounter: Secondary | ICD-10-CM | POA: Insufficient documentation

## 2015-05-15 NOTE — Progress Notes (Signed)
[facility] [place] HISTORY AND PHYSICAL -PAD  History of Present Illness Allison [name] is a 45 y.o. female patient of Dr. Hart [name] is seen in follow-up regarding a traumatic injury-dog bite on March 29, 2014, to the left upper extremity with extensive soft tissue injury, nerve injury, and total disruption of left radial artery. Upon exploration the patient had good flow intact in the ulnar artery with the radial artery totally evulsed and it was ligated proximally and distally. Patient has had satisfactory arterial flow. She is currently going to rehabilitation frequently and continues to have 2 wounds which have not completely healed in the left forearm and nerve dysfunction. She is followed by Dr. [name], hand surgeon.  She also sustained dog bites to her left leg and has residual numbness at anterior thigh and most of her calf. Pt denies any history of stroke, denies any history of cardiac problems.   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  She had left hand surgery in October 2016 to try to improve her ability to make a fist  Pt Diabetic: No Pt smoker: smoker (1/2 ppd, started smoking at about age 45 yrs), she quit for 3 months after the left arm surgery.  Pt meds include: Statin :No, pt states her cholesterol has been OK.  Betablocker: No ASA: Yes, 81 mg daily Other anticoagulants/antiplatelets: no     Past Medical History  Diagnosis Date  . Thyroid disease   . Hypothyroidism     Social History Social History  Substance Use Topics  . Smoking status: Current Every Day Smoker -- 1.00 packs/day    Types: Cigarettes  . Smokeless tobacco: Never Used  . Alcohol Use: Yes     Comment: Rarely    Family History Family History  Problem Relation Age of Onset  . Breast cancer Mother 1865  . Cancer Mother     Breast  . Heart attack Father     Past Surgical History  Procedure Laterality Date  . Complex wound closure Left 03/29/2014   Procedure: CLOSURE MULTIPLE LACERATIONS LEFT LEG AND Left ARM, Embolectomy., LIGATION OF RADIAL ARTERY;  Surgeon: [name], MD;  [facility];  Service: General;  Laterality: Left;  . I&d extremity Left 03/29/2014    Procedure: IRRIGATION AND DEBRIDEMENT CLOSURE MULTIPLE LACERATIONS HAND AND ARM, lower legs;  Surgeon: [name], MD;  Location[facility];  Service: General;  Laterality: Left;  . Dressing change under anesthesia Left 04/01/2014    Procedure: DRESSING CHANGE LEFT UPPER EXTERMITY AND LEFT HAND UNDER ANESTHESIA;  Surgeon: [name], MD;  Location: [facility] OR;  Service: Orthopedics;  Laterality: Left;  . Abdominal hysterectomy    . Removed part of ovary    . Dupuytren contracture release Left 11/13/2014    Procedure: LEFT WRIST MANIPULATION, LEFT THUMB INTERPHALANGEAL JOINT MANIPULATION ;  Surgeon: [name], MD;  Location: [facility];  Service: Orthopedics;  Laterality: Left;  . Capsulotomy Left 11/13/2014    Procedure: METACARPAL JOINT RELEASE LEFT INDEX,MIDDLE,RING, AND SMALL FINGERS;  Surgeon: [name], MD;  Location: [facility];  Service: Orthopedics;  Laterality: Left;    No Known Allergies  Current Outpatient Prescriptions  Medication Sig Dispense Refill  . OVER THE COUNTER MEDICATION Take 1 tablet by mouth daily. EHT-multtivitamin    . SYNTHROID 137 MCG tablet TK 1 T PO D  11  . HYDROcodone-acetaminophen (NORCO) 5-325 MG tablet Take 1 tablet by mouth every 6 (  six) hours as needed for moderate pain. (Patient not taking: Reported on 05/15/2015) 30 tablet 0  . levothyroxine (SYNTHROID, LEVOTHROID) 150 MCG tablet Take 150 mcg by mouth daily before breakfast. Reported on 05/15/2015     No current facility-administered medications for this visit.    ROS: See HPI for pertinent positives and negatives.   Physical Examination  Filed Vitals:   05/15/15 0911  BP: 118/72  Pulse: 65  Height:  (1.6 m)  Weight: 121 lb 14.4 oz  (55.293 kg)  SpO2: 99%   Body mass index is 21.6 kg/(m^2).   General: A&O x 3, WDWN. Gait: normal Eyes: PERRLA. Pulmonary: CTAB, without wheezes , rales or rhonchi. Cardiac: regular Rythm, without detected murmur.     Carotid Bruits Right Left   Negative Negative  Aorta is slightly palpable. Radial pulses: right is 2+ palpable, left radial is not palpable, left ulnar pulse is palpable and triphasic by Doppler. Left brachial pulse is 2+ palpable   VASCULAR EXAM: Extremities without ischemic changes, without Gangrene; without open wounds. Fingers of both hands are warm with brisk capillary refill.     LE Pulses Right Left   FEMORAL 2+ palpable 2+ palpable    POPLITEAL 2+ palpable  2+ palpable   POSTERIOR TIBIAL 2+ palpable  1+ palpable    DORSALIS PEDIS  ANTERIOR TIBIAL 2+ palpable  1+ palpable    Abdomen: soft, NT, no palpable masses. Skin: no rashes, no ulcers. Musculoskeletal: traumatic and surgical repair muscle deformity in left upper extremity and left lower extremity. Neurologic: A&O X 3; Appropriate Affect; MOTOR FUNCTION: moving all extremities, motor strength 5/5 throughout except 3/5 in left upper extremity, unable to make a fist with left hand. Speech is fluent/normal. CN 2-12 intact.           Non-Invasive Vascular Imaging: DATE: 05/15/2015 UPPER EXTREMITY ARTERIAL DUPLEX EVALUATION    INDICATION: Left upper extremity evaluation of the left brachial, radial and ulnar arteries, arm pain    PREVIOUS INTERVENTION(S): Trauma due to dog bite. The left radial artery was ligated proximally and distally 03/29/2014.    DUPLEX EXAM:     RIGHT LOCATION LEFT   Peak Systolic Velocity (cm/s) Ratio (if abnormal) Waveform  Peak Systolic Velocity  (cm/s) Ratio (if abnormal) Waveform     Subclavian Artery 63  T     Axillary Artery 76  T     Brachial Artery Proximal 84  T     Brachial Artery Mid 63  T     Brachial Artery Distal  70  T     Radial Artery Proximal Ligated  T     Radial Artery Distal Ligated  T     Ulnar Artery Proximal 82  T     Ulnar Artery Distal 62  T   Waveform:    M - Monophasic       B - Biphasic       T - Triphasic    ADDITIONAL FINDINGS:     IMPRESSION: 1. Known ligated left radial artery 2. No evidence of left upper extremity arterial occlusive disease, patent throughout     ASSESSMENT: Allison [name] is a 45 y.o. female who is s/p exploration of left brachial, radial and ulnar arteries with ligation of the radial artery at origin and distally on 03/29/2014 subsequent to severe dog bite injuries. Today's left upper extremity arterial duplex suggests widely patent left brachial and ulnar arteries with triphasic waveforms, known ligated left radial artery.  Pt  has ongoing numbness and limited flexion of the fingers of her left hand due to nerve injury. She has another surgery scheduled this month to help enable her to better flex her left fingers.  She also has some nerve damage and numbness in her left leg from dog bite injuries from that same incident.   Fortunately she has no history of any cardiovascular events and she does not have DM. But unfortunately she continues to smoke. We discussed the vasospasm effect that nicotine has on the arterial system which limits the blood flow to her left upper extremity which is of most concern, and also limits blood flow to remainder of her body.     PLAN:  Based on the patient's vascular studies and examination, pt will return to clinic in 6 months months with left upper extremity arterial duplex. She knows to notify our office immediately with concerns re the circulation in her left upper extremity.  The patient was counseled re smoking cessation and given  several free resources re smoking cessation.  I discussed in depth with the patient the nature of atherosclerosis, and emphasized the importance of maximal medical management including strict control of blood pressure, blood glucose, and lipid levels, obtaining regular exercise, and cessation of smoking.  The patient is aware that without maximal medical management the underlying atherosclerotic disease process will progress, limiting the benefit of any interventions.  The patient was given information about PAD including signs, symptoms, treatment, what symptoms should prompt the patient to seek immediate medical care, and risk reduction measures to take.  Charisse March, RN, MSN, FNP-C [facility] Phone: [phone]  Clinic MD: Early  05/15/2015 9:24 AM

## 2015-05-15 NOTE — Patient Instructions (Signed)
Smoking Cessation, Tips for Success If you are ready to quit smoking, congratulations! You have chosen to help yourself be healthier. Cigarettes bring nicotine, tar, carbon monoxide, and other irritants into your body. Your lungs, heart, and blood vessels will be able to work better without these poisons. There are many different ways to quit smoking. Nicotine gum, nicotine patches, a nicotine inhaler, or nicotine nasal spray can help with physical craving. Hypnosis, support groups, and medicines help break the habit of smoking. WHAT THINGS CAN I DO TO MAKE QUITTING EASIER?  Here are some tips to help you quit for good:  Pick a date when you will quit smoking completely. Tell all of your friends and family about your plan to quit on that date.  Do not try to slowly cut down on the number of cigarettes you are smoking. Pick a quit date and quit smoking completely starting on that day.  Throw away all cigarettes.   Clean and remove all ashtrays from your home, work, and car.  On a card, write down your reasons for quitting. Carry the card with you and read it when you get the urge to smoke.  Cleanse your body of nicotine. Drink enough water and fluids to keep your urine clear or pale yellow. Do this after quitting to flush the nicotine from your body.  Learn to predict your moods. Do not let a bad situation be your excuse to have a cigarette. Some situations in your life might tempt you into wanting a cigarette.  Never have "just one" cigarette. It leads to wanting another and another. Remind yourself of your decision to quit.  Change habits associated with smoking. If you smoked while driving or when feeling stressed, try other activities to replace smoking. Stand up when drinking your coffee. Brush your teeth after eating. Sit in a different chair when you read the paper. Avoid alcohol while trying to quit, and try to drink fewer caffeinated beverages. Alcohol and caffeine may urge you to  smoke.  Avoid foods and drinks that can trigger a desire to smoke, such as sugary or spicy foods and alcohol.  Ask people who smoke not to smoke around you.  Have something planned to do right after eating or having a cup of coffee. For example, plan to take a walk or exercise.  Try a relaxation exercise to calm you down and decrease your stress. Remember, you may be tense and nervous for the first 2 weeks after you quit, but this will pass.  Find new activities to keep your hands busy. Play with a pen, coin, or rubber band. Doodle or draw things on paper.  Brush your teeth right after eating. This will help cut down on the craving for the taste of tobacco after meals. You can also try mouthwash.   Use oral substitutes in place of cigarettes. Try using lemon drops, carrots, cinnamon sticks, or chewing gum. Keep them handy so they are available when you have the urge to smoke.  When you have the urge to smoke, try deep breathing.  Designate your home as a nonsmoking area.  If you are a heavy smoker, ask your health care provider about a prescription for nicotine chewing gum. It can ease your withdrawal from nicotine.  Reward yourself. Set aside the cigarette money you save and buy yourself something nice.  Look for support from others. Join a support group or smoking cessation program. Ask someone at home or at work to help you with your plan   to quit smoking.  Always ask yourself, "Do I need this cigarette or is this just a reflex?" Tell yourself, "Today, I choose not to smoke," or "I do not want to smoke." You are reminding yourself of your decision to quit.  Do not replace cigarette smoking with electronic cigarettes (commonly called e-cigarettes). The safety of e-cigarettes is unknown, and some may contain harmful chemicals.  If you relapse, do not give up! Plan ahead and think about what you will do the next time you get the urge to smoke. HOW WILL I FEEL WHEN I QUIT SMOKING? You  may have symptoms of withdrawal because your body is used to nicotine (the addictive substance in cigarettes). You may crave cigarettes, be irritable, feel very hungry, cough often, get headaches, or have difficulty concentrating. The withdrawal symptoms are only temporary. They are strongest when you first quit but will go away within 10-14 days. When withdrawal symptoms occur, stay in control. Think about your reasons for quitting. Remind yourself that these are signs that your body is healing and getting used to being without cigarettes. Remember that withdrawal symptoms are easier to treat than the major diseases that smoking can cause.  Even after the withdrawal is over, expect periodic urges to smoke. However, these cravings are generally short lived and will go away whether you smoke or not. Do not smoke! WHAT RESOURCES ARE AVAILABLE TO HELP ME QUIT SMOKING? Your health care provider can direct you to community resources or hospitals for support, which may include:  Group support.  Education.  Hypnosis.  Therapy.   This information is not intended to replace advice given to you by your health care provider. Make sure you discuss any questions you have with your health care provider.   Document Released: 10/12/2003 Document Revised: 02/03/2014 Document Reviewed: 07/01/2012 Elsevier Interactive Patient Education 2016 Elsevier Inc.    Steps to Quit Smoking  Smoking tobacco can be harmful to your health and can affect almost every organ in your body. Smoking puts you, and those around you, at risk for developing many serious chronic diseases. Quitting smoking is difficult, but it is one of the best things that you can do for your health. It is never too late to quit. WHAT ARE THE BENEFITS OF QUITTING SMOKING? When you quit smoking, you lower your risk of developing serious diseases and conditions, such as:  Lung cancer or lung disease, such as COPD.  Heart disease.  Stroke.  Heart  attack.  Infertility.  Osteoporosis and bone fractures. Additionally, symptoms such as coughing, wheezing, and shortness of breath may get better when you quit. You may also find that you get sick less often because your body is stronger at fighting off colds and infections. If you are pregnant, quitting smoking can help to reduce your chances of having a baby of low birth weight. HOW DO I GET READY TO QUIT? When you decide to quit smoking, create a plan to make sure that you are successful. Before you quit:  Pick a date to quit. Set a date within the next two weeks to give you time to prepare.  Write down the reasons why you are quitting. Keep this list in places where you will see it often, such as on your bathroom mirror or in your car or wallet.  Identify the people, places, things, and activities that make you want to smoke (triggers) and avoid them. Make sure to take these actions:  Throw away all cigarettes at home, at work,   and in your car.  Throw away smoking accessories, such as ashtrays and lighters.  Clean your car and make sure to empty the ashtray.  Clean your home, including curtains and carpets.  Tell your family, friends, and coworkers that you are quitting. Support from your loved ones can make quitting easier.  Talk with your health care provider about your options for quitting smoking.  Find out what treatment options are covered by your health insurance. WHAT STRATEGIES CAN I USE TO QUIT SMOKING?  Talk with your healthcare provider about different strategies to quit smoking. Some strategies include:  Quitting smoking altogether instead of gradually lessening how much you smoke over a period of time. Research shows that quitting "cold turkey" is more successful than gradually quitting.  Attending in-person counseling to help you build problem-solving skills. You are more likely to have success in quitting if you attend several counseling sessions. Even short  sessions of 10 minutes can be effective.  Finding resources and support systems that can help you to quit smoking and remain smoke-free after you quit. These resources are most helpful when you use them often. They can include:  Online chats with a counselor.  Telephone quitlines.  Printed self-help materials.  Support groups or group counseling.  Text messaging programs.  Mobile phone applications.  Taking medicines to help you quit smoking. (If you are pregnant or breastfeeding, talk with your health care provider first.) Some medicines contain nicotine and some do not. Both types of medicines help with cravings, but the medicines that include nicotine help to relieve withdrawal symptoms. Your health care provider may recommend:  Nicotine patches, gum, or lozenges.  Nicotine inhalers or sprays.  Non-nicotine medicine that is taken by mouth. Talk with your health care provider about combining strategies, such as taking medicines while you are also receiving in-person counseling. Using these two strategies together makes you more likely to succeed in quitting than if you used either strategy on its own. If you are pregnant or breastfeeding, talk with your health care provider about finding counseling or other support strategies to quit smoking. Do not take medicine to help you quit smoking unless told to do so by your health care provider. WHAT THINGS CAN I DO TO MAKE IT EASIER TO QUIT? Quitting smoking might feel overwhelming at first, but there is a lot that you can do to make it easier. Take these important actions:  Reach out to your family and friends and ask that they support and encourage you during this time. Call telephone quitlines, reach out to support groups, or work with a counselor for support.  Ask people who smoke to avoid smoking around you.  Avoid places that trigger you to smoke, such as bars, parties, or smoke-break areas at work.  Spend time around people who do  not smoke.  Lessen stress in your life, because stress can be a smoking trigger for some people. To lessen stress, try:  Exercising regularly.  Deep-breathing exercises.  Yoga.  Meditating.  Performing a body scan. This involves closing your eyes, scanning your body from head to toe, and noticing which parts of your body are particularly tense. Purposefully relax the muscles in those areas.  Download or purchase mobile phone or tablet apps (applications) that can help you stick to your quit plan by providing reminders, tips, and encouragement. There are many free apps, such as QuitGuide from the CDC (Centers for Disease Control and Prevention). You can find other support for quitting smoking (smoking   cessation) through smokefree.gov and other websites. HOW WILL I FEEL WHEN I QUIT SMOKING? Within the first 24 hours of quitting smoking, you may start to feel some withdrawal symptoms. These symptoms are usually most noticeable 2-3 days after quitting, but they usually do not last beyond 2-3 weeks. Changes or symptoms that you might experience include:  Mood swings.  Restlessness, anxiety, or irritation.  Difficulty concentrating.  Dizziness.  Strong cravings for sugary foods in addition to nicotine.  Mild weight gain.  Constipation.  Nausea.  Coughing or a sore throat.  Changes in how your medicines work in your body.  A depressed mood.  Difficulty sleeping (insomnia). After the first 2-3 weeks of quitting, you may start to notice more positive results, such as:  Improved sense of smell and taste.  Decreased coughing and sore throat.  Slower heart rate.  Lower blood pressure.  Clearer skin.  The ability to breathe more easily.  Fewer sick days. Quitting smoking is very challenging for most people. Do not get discouraged if you are not successful the first time. Some people need to make many attempts to quit before they achieve long-term success. Do your best to  stick to your quit plan, and talk with your health care provider if you have any questions or concerns.   This information is not intended to replace advice given to you by your health care provider. Make sure you discuss any questions you have with your health care provider.   Document Released: 01/07/2001 Document Revised: 05/30/2014 Document Reviewed: 05/30/2014 Elsevier Interactive Patient Education 2016 Elsevier Inc.  

## 2015-06-22 ENCOUNTER — Other Ambulatory Visit: Payer: Self-pay | Admitting: *Deleted

## 2015-06-22 DIAGNOSIS — I739 Peripheral vascular disease, unspecified: Secondary | ICD-10-CM

## 2015-11-22 ENCOUNTER — Encounter: Payer: Self-pay | Admitting: Family

## 2015-11-27 ENCOUNTER — Ambulatory Visit: Payer: BLUE CROSS/BLUE SHIELD | Admitting: Family

## 2015-11-27 ENCOUNTER — Encounter (HOSPITAL_COMMUNITY): Payer: Self-pay

## 2015-12-12 ENCOUNTER — Encounter: Payer: Self-pay | Admitting: Family

## 2015-12-17 ENCOUNTER — Ambulatory Visit (INDEPENDENT_AMBULATORY_CARE_PROVIDER_SITE_OTHER): Payer: BLUE CROSS/BLUE SHIELD | Admitting: Family

## 2015-12-17 ENCOUNTER — Ambulatory Visit (HOSPITAL_COMMUNITY)
Admission: RE | Admit: 2015-12-17 | Discharge: 2015-12-17 | Disposition: A | Discharge status: Home / Self Care | Payer: BLUE CROSS/BLUE SHIELD | Source: Ambulatory Visit | Attending: Family | Admitting: Family

## 2015-12-17 ENCOUNTER — Encounter: Payer: Self-pay | Admitting: Family

## 2015-12-17 VITALS — BP 116/74 | HR 60 | Temp 98.0°F | Resp 14 | Ht 63.5 in | Wt 120.0 lb

## 2015-12-17 DIAGNOSIS — S55102D Unspecified injury of radial artery at forearm level, left arm, subsequent encounter: Secondary | ICD-10-CM

## 2015-12-17 DIAGNOSIS — F17200 Nicotine dependence, unspecified, uncomplicated: Secondary | ICD-10-CM | POA: Diagnosis not present

## 2015-12-17 DIAGNOSIS — Z48812 Encounter for surgical aftercare following surgery on the circulatory system: Secondary | ICD-10-CM

## 2015-12-17 DIAGNOSIS — I739 Peripheral vascular disease, unspecified: Secondary | ICD-10-CM

## 2015-12-17 NOTE — Patient Instructions (Signed)
Steps to Quit Smoking Smoking tobacco can be bad for your health. It can also affect almost every organ in your body. Smoking puts you and people around you at risk for many serious long-lasting (chronic) diseases. Quitting smoking is hard, but it is one of the best things that you can do for your health. It is never too late to quit. What are the benefits of quitting smoking? When you quit smoking, you lower your risk for getting serious diseases and conditions. They can include:  Lung cancer or lung disease.  Heart disease.  Stroke.  Heart attack.  Not being able to have children (infertility).  Weak bones (osteoporosis) and broken bones (fractures). If you have coughing, wheezing, and shortness of breath, those symptoms may get better when you quit. You may also get sick less often. If you are pregnant, quitting smoking can help to lower your chances of having a baby of low birth weight. What can I do to help me quit smoking? Talk with your doctor about what can help you quit smoking. Some things you can do (strategies) include:  Quitting smoking totally, instead of slowly cutting back how much you smoke over a period of time.  Going to in-person counseling. You are more likely to quit if you go to many counseling sessions.  Using resources and support systems, such as:  Online chats with a counselor.  Phone quitlines.  Printed self-help materials.  Support groups or group counseling.  Text messaging programs.  Mobile phone apps or applications.  Taking medicines. Some of these medicines may have nicotine in them. If you are pregnant or breastfeeding, do not take any medicines to quit smoking unless your doctor says it is okay. Talk with your doctor about counseling or other things that can help you. Talk with your doctor about using more than one strategy at the same time, such as taking medicines while you are also going to in-person counseling. This can help make quitting  easier. What things can I do to make it easier to quit? Quitting smoking might feel very hard at first, but there is a lot that you can do to make it easier. Take these steps:  Talk to your family and friends. Ask them to support and encourage you.  Call phone quitlines, reach out to support groups, or work with a counselor.  Ask people who smoke to not smoke around you.  Avoid places that make you want (trigger) to smoke, such as:  Bars.  Parties.  Smoke-break areas at work.  Spend time with people who do not smoke.  Lower the stress in your life. Stress can make you want to smoke. Try these things to help your stress:  Getting regular exercise.  Deep-breathing exercises.  Yoga.  Meditating.  Doing a body scan. To do this, close your eyes, focus on one area of your body at a time from head to toe, and notice which parts of your body are tense. Try to relax the muscles in those areas.  Download or buy apps on your mobile phone or tablet that can help you stick to your quit plan. There are many free apps, such as QuitGuide from the CDC (Centers for Disease Control and Prevention). You can find more support from smokefree.gov and other websites. This information is not intended to replace advice given to you by your health care provider. Make sure you discuss any questions you have with your health care provider. Document Released: 11/09/2008 Document Revised: 09/11/2015 Document   Reviewed: 05/30/2014 Elsevier Interactive Patient Education  2017 Elsevier Inc.  

## 2015-12-17 NOTE — Progress Notes (Signed)
[facility] [place]   CC: Follow up s/p radial artery injury and repair  History of Present Illness Allison [name] is a 45 y.o. female patient of Dr. Hart Allison [name] is seen in follow-up regarding a traumatic injury-dog bite on March 29, 2014, to the left upper extremity with extensive soft tissue injury, nerve injury, and total disruption of left radial artery. Upon exploration the patient had good flow intact in the ulnar artery with the radial artery totally evulsed and it was ligated proximally and distally. Patient has had satisfactory arterial flow.  She was followed by Dr. Dairl PonderMatthew [name], hand surgeon.  She also sustained dog bites to her left leg and has residual numbness at anterior thigh and most of her calf. Pt denies any history of stroke, denies any history of cardiac problems.   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  She had left hand surgery in October 2016 to try to improve her ability to make a fist  Pt Diabetic: No Pt smoker: smoker (1/2 ppd, started smoking at about age 89 yrs), she quit for 3 months after the left arm surgery.  Pt meds include: Statin :No, pt states her cholesterol has been OK.  Betablocker: No ASA: Yes, 81 mg daily Other anticoagulants/antiplatelets: no     Past Medical History:  Diagnosis Date  . Hypothyroidism   . Thyroid disease     Social History Social History  Substance Use Topics  . Smoking status: Current Every Day Smoker    Packs/day: 1.00    Types: Cigarettes  . Smokeless tobacco: Never Used  . Alcohol use Yes     Comment: Rarely    Family History Family History  Problem Relation Age of Onset  . Breast cancer Mother [id number]  . Cancer Mother     Breast  . Heart attack Father     Past Surgical History:  Procedure Laterality Date  . ABDOMINAL HYSTERECTOMY    . CAPSULOTOMY Left 11/13/2014   Procedure: METACARPAL JOINT RELEASE LEFT INDEX,MIDDLE,RING, AND SMALL FINGERS;  Surgeon: Allison PonderMatthew  Weingold, MD;  Location: [facility];  Service: Orthopedics;  Laterality: Left;  . COMPLEX WOUND CLOSURE Left 03/29/2014   Procedure: CLOSURE MULTIPLE LACERATIONS LEFT LEG AND Left ARM, Embolectomy., LIGATION OF RADIAL ARTERY;  Surgeon: Allison SchmidtJay Wyatt, MD;  Location: [facility];  Service: General;  Laterality: Left;  . DRESSING CHANGE UNDER ANESTHESIA Left 04/01/2014   Procedure: DRESSING CHANGE LEFT UPPER EXTERMITY AND LEFT HAND UNDER ANESTHESIA;  Surgeon: Allison PonderMatthew Weingold, MD;  Location: [facility] OR;  Service: Orthopedics;  Laterality: Left;  . 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 I&D EXTREMITY Left 03/29/2014   Procedure: IRRIGATION AND DEBRIDEMENT CLOSURE MULTIPLE LACERATIONS HAND AND ARM, lower legs;  Surgeon: Allison SchmidtJay Wyatt, MD;  Location: [facility] OR;  Service: General;  Laterality: Left;  . Removed part of ovary      No Known Allergies  Current Outpatient Prescriptions  Medication Sig Dispense Refill  . aspirin 81 MG chewable tablet Chew by mouth.    [name]. OVER THE COUNTER MEDICATION Take 1 tablet by mouth daily. EHT-multtivitamin    . SYNTHROID 137 MCG tablet TK 1 T PO D  11   No current facility-administered medications for this visit.     ROS: See HPI for pertinent positives and  negatives.   Physical Examination  Vitals:   12/17/15 0943  BP: 116/74  Pulse: 60  Resp: 14  Temp: 98 F (36.7 C)  TempSrc: Oral  SpO2: 100%  Weight: 120 lb (54.4 kg)  Height: 5' 3.5" (1.613 m)   Body mass index is 20.92 kg/m.  General: A&O x 3, WDWN. Gait: normal Eyes: PERRLA. Pulmonary: Respirations are non labored, CTAB, no wheezes, rales or rhonchi. Cardiac: regular Rhythm, not detected murmur.     Carotid Bruits Right Left   Negative Negative  Aorta is slightly palpable. Radial  pulses: right is 2+ palpable, left radial is not palpable, left ulnar pulse is palpable and triphasic by Doppler. Left brachial pulse is 2+ palpable   VASCULAR EXAM: Extremities without ischemic changes, without Gangrene; without open wounds. Fingers of both hands are warm with brisk capillary refill.     LE Pulses Right Left   FEMORAL 2+ palpable 2+ palpable    POPLITEAL 2+ palpable  2+ palpable   POSTERIOR TIBIAL 2+ palpable  1+ palpable    DORSALIS PEDIS  ANTERIOR TIBIAL 2+ palpable  1+ palpable    Abdomen: soft, NT, no palpable masses. Skin: no rashes, no ulcers. Musculoskeletal: traumatic and surgical repair muscle deformity in left upper extremity and left lower extremity. Neurologic: A&O X 3; Appropriate Affect; MOTOR FUNCTION: moving all extremities, motor strength 5/5 throughout except 4/5 in left upper extremity, able to make a partial fist with left hand. Speech is fluent/normal. CN 2-12 intact.    ASSESSMENT: Allison Orismberly Throne is a 45 y.o. female who is s/p exploration of left brachial, radial and ulnar arteries with ligation of the radial artery at origin and distally on 03/29/2014 subsequent to severe dog bite injuries.  Pt has ongoing numbness and limited flexion of the fingers of her left hand due to nerve injury, but this is improving.  She also has some nerve damage and numbness in her left leg from dog bite injuries from that same incident.   Fortunately she has no history of any cardiovascular events and she does not have DM. But unfortunately she continues to smoke. We discussed the vasospasm effect that nicotine has on the arterial system which limits the blood flow to her left upper extremity which is of most concern, and also limits blood flow to  remainder of her body.     DATA Today's left upper extremity arterial duplex suggests widely patent left brachial and ulnar arteries with triphasic waveforms, known ligated left radial artery.    PLAN:  Based on the patient's vascular studies and examination, pt will return to clinic in 1 year with left upper extremity arterial duplex.   I discussed in depth with the patient the nature of atherosclerosis, and emphasized the importance of maximal medical management including strict control of blood pressure, blood glucose, and lipid levels, obtaining regular exercise, and cessation of smoking.  The patient is aware that without maximal medical management the underlying atherosclerotic disease process will progress, limiting the benefit of any interventions.  The patient was given information about PAD including signs, symptoms, treatment, what symptoms should prompt the patient to seek immediate medical care, and risk reduction measures to take.  Charisse MarchSuzanne Delois Tolbert, RN, MSN, FNP-C [facility] [place] Office Phone: [phone]  Clinic MD: Randie HeinzCain on call  12/17/15 9:51 AM

## 2015-12-18 NOTE — Addendum Note (Signed)
Addended by: Burton ApleyPETTY, Teigen Bellin A on: 12/18/2015 08:29 AM   Modules accepted: Orders

## 2016-12-22 ENCOUNTER — Other Ambulatory Visit (HOSPITAL_COMMUNITY): Payer: Self-pay

## 2016-12-22 ENCOUNTER — Ambulatory Visit: Payer: BLUE CROSS/BLUE SHIELD | Admitting: Family
